# Patient Record
Sex: Female | Born: 1980 | Race: White | Hispanic: No | Marital: Married | Smoking: Never smoker
Health system: Southern US, Community
[De-identification: ages and names within clinical notes are randomized; demographics above are authoritative.]

## PROBLEM LIST (undated history)

## (undated) ENCOUNTER — Inpatient Hospital Stay (HOSPITAL_COMMUNITY): Payer: Self-pay

## (undated) DIAGNOSIS — Z8759 Personal history of other complications of pregnancy, childbirth and the puerperium: Secondary | ICD-10-CM

## (undated) DIAGNOSIS — Z8619 Personal history of other infectious and parasitic diseases: Secondary | ICD-10-CM

## (undated) DIAGNOSIS — D649 Anemia, unspecified: Secondary | ICD-10-CM

## (undated) HISTORY — PX: WISDOM TOOTH EXTRACTION: SHX21

## (undated) HISTORY — DX: Personal history of other infectious and parasitic diseases: Z86.19

## (undated) HISTORY — DX: Personal history of other complications of pregnancy, childbirth and the puerperium: Z87.59

## (undated) HISTORY — PX: APPENDECTOMY: SHX54

---

## 2014-10-20 NOTE — L&D Delivery Note (Signed)
Delivery Note Pt reached complete dilation and pushed for about an hour and at 3:37 AM a viable female was delivered via  (Presentation:OA).  APGAR:8,9 ; weight pending  .   Placenta status: delivered spontaneously .  Cord:  with the following complications:none .  Anesthesia:  epidural Episiotomy:  na Lacerations:  Small first degree vaginal Suture Repair: 3.0 vicryl rapide for hemostasis Est. Blood Loss (mL):  356ml Mom to postpartum.  Baby to Couplet care / Skin to Skin.  Stephanie Prince 08/15/2015, 3:57 AM

## 2015-01-14 LAB — OB RESULTS CONSOLE ABO/RH: RH TYPE: POSITIVE

## 2015-01-14 LAB — OB RESULTS CONSOLE GC/CHLAMYDIA
CHLAMYDIA, DNA PROBE: NEGATIVE
Gonorrhea: NEGATIVE

## 2015-01-14 LAB — OB RESULTS CONSOLE RUBELLA ANTIBODY, IGM: RUBELLA: IMMUNE

## 2015-02-14 LAB — OB RESULTS CONSOLE HEPATITIS B SURFACE ANTIGEN: HEP B S AG: NEGATIVE

## 2015-06-28 LAB — OB RESULTS CONSOLE HIV ANTIBODY (ROUTINE TESTING): HIV: NONREACTIVE

## 2015-06-28 LAB — OB RESULTS CONSOLE RPR: RPR: NONREACTIVE

## 2015-07-17 LAB — OB RESULTS CONSOLE GBS: GBS: POSITIVE

## 2015-08-08 ENCOUNTER — Other Ambulatory Visit: Payer: Self-pay | Admitting: Student

## 2015-08-08 ENCOUNTER — Inpatient Hospital Stay (HOSPITAL_COMMUNITY)
Admission: AD | Admit: 2015-08-08 | Discharge: 2015-08-08 | Disposition: A | Discharge status: Home / Self Care | Payer: 59 | Source: Ambulatory Visit | Attending: Obstetrics and Gynecology | Admitting: Obstetrics and Gynecology

## 2015-08-08 ENCOUNTER — Other Ambulatory Visit (HOSPITAL_COMMUNITY): Payer: Self-pay | Admitting: Student

## 2015-08-08 ENCOUNTER — Other Ambulatory Visit (HOSPITAL_COMMUNITY)
Admission: AD | Admit: 2015-08-08 | Discharge: 2015-08-08 | Disposition: A | Discharge status: Home / Self Care | Payer: 59 | Source: Ambulatory Visit | Attending: Obstetrics and Gynecology | Admitting: Obstetrics and Gynecology

## 2015-08-08 ENCOUNTER — Encounter (HOSPITAL_COMMUNITY): Payer: Self-pay

## 2015-08-08 DIAGNOSIS — Z3A34 34 weeks gestation of pregnancy: Secondary | ICD-10-CM | POA: Diagnosis not present

## 2015-08-08 DIAGNOSIS — O133 Gestational [pregnancy-induced] hypertension without significant proteinuria, third trimester: Secondary | ICD-10-CM

## 2015-08-08 DIAGNOSIS — O139 Gestational [pregnancy-induced] hypertension without significant proteinuria, unspecified trimester: Secondary | ICD-10-CM

## 2015-08-08 DIAGNOSIS — O1493 Unspecified pre-eclampsia, third trimester: Secondary | ICD-10-CM | POA: Diagnosis not present

## 2015-08-08 DIAGNOSIS — O1213 Gestational proteinuria, third trimester: Secondary | ICD-10-CM

## 2015-08-08 HISTORY — DX: Anemia, unspecified: D64.9

## 2015-08-08 LAB — COMPREHENSIVE METABOLIC PANEL
ALBUMIN: 3 g/dL — AB (ref 3.5–5.0)
ALT: 20 U/L (ref 14–54)
ANION GAP: 9 (ref 5–15)
AST: 31 U/L (ref 15–41)
Alkaline Phosphatase: 131 U/L — ABNORMAL HIGH (ref 38–126)
BILIRUBIN TOTAL: 0.4 mg/dL (ref 0.3–1.2)
BUN: 9 mg/dL (ref 6–20)
CO2: 22 mmol/L (ref 22–32)
Calcium: 8.5 mg/dL — ABNORMAL LOW (ref 8.9–10.3)
Chloride: 103 mmol/L (ref 101–111)
Creatinine, Ser: 0.68 mg/dL (ref 0.44–1.00)
GFR calc Af Amer: >60 mL/min (ref >60)
GFR calc non Af Amer: >60 mL/min (ref >60)
GLUCOSE: 83 mg/dL (ref 65–99)
POTASSIUM: 3.7 mmol/L (ref 3.5–5.1)
SODIUM: 134 mmol/L — AB (ref 135–145)
TOTAL PROTEIN: 7.2 g/dL (ref 6.5–8.1)

## 2015-08-08 LAB — CBC
HCT: 32.8 % — ABNORMAL LOW (ref 36.0–46.0)
Hemoglobin: 10.7 g/dL — ABNORMAL LOW (ref 12.0–15.0)
MCH: 28.8 pg (ref 26.0–34.0)
MCHC: 32.6 g/dL (ref 30.0–36.0)
MCV: 88.4 fL (ref 78.0–100.0)
PLATELETS: 165 10*3/uL (ref 150–400)
RBC: 3.71 MIL/uL — AB (ref 3.87–5.11)
RDW: 13.5 % (ref 11.5–15.5)
WBC: 10.3 10*3/uL (ref 4.0–10.5)

## 2015-08-08 LAB — PROTEIN / CREATININE RATIO, URINE
Creatinine, Urine: 44 mg/dL
PROTEIN CREATININE RATIO: 1.11 mg/mg{creat} — AB (ref 0.00–0.15)
Total Protein, Urine: 49 mg/dL

## 2015-08-08 LAB — URINALYSIS, ROUTINE W REFLEX MICROSCOPIC
BILIRUBIN URINE: NEGATIVE
Glucose, UA: NEGATIVE mg/dL
Ketones, ur: NEGATIVE mg/dL
Leukocytes, UA: NEGATIVE
NITRITE: NEGATIVE
PH: 7.5 (ref 5.0–8.0)
Protein, ur: 30 mg/dL — AB
SPECIFIC GRAVITY, URINE: 1.015 (ref 1.005–1.030)
Urobilinogen, UA: 0.2 mg/dL (ref 0.0–1.0)

## 2015-08-08 LAB — URINE MICROSCOPIC-ADD ON

## 2015-08-08 LAB — URIC ACID: URIC ACID, SERUM: 4.2 mg/dL (ref 2.3–6.6)

## 2015-08-08 LAB — LACTATE DEHYDROGENASE: LDH: 152 U/L (ref 98–192)

## 2015-08-08 NOTE — Discharge Instructions (Signed)
24-Hour Urine Collection HOW DO I DO A 24-HOUR URINE COLLECTION?  When you get up in the morning, urinate in the toilet and flush. Write down the time. This will be your start time on the day of collection and your end time on the next morning.  From then on, collect all of your urine in the plastic jug that is given to you.  Stop collecting your urine 24 hours after you started.  If the plastic jug that is given to you already has liquid in it, that is okay. Do not throw out the liquid or rinse out the jug. Some tests need the liquid to be added to your urine.  Keep your plastic jug cool in an ice chest or keep it in the refrigerator during the test.  When 24 hours are over, bring your plastic jug to the clinic lab. Keep the jug cool in an ice chest while you are bringing it to the lab.   This information is not intended to replace advice given to you by your health care provider. Make sure you discuss any questions you have with your health care provider.   Document Released: 01/02/2009 Document Revised: 10/27/2014 Document Reviewed: 03/01/2014 Elsevier Interactive Patient Education 2016 [employer].     Hypertension During Pregnancy Hypertension, or high blood pressure, is when there is extra pressure inside your blood vessels that carry blood from the heart to the rest of your body (arteries). It can happen at any time in life, including pregnancy. Hypertension during pregnancy can cause problems for you and your baby. Your baby might not weigh as much as he or she should at birth or might be born early (premature). Very bad cases of hypertension during pregnancy can be life-threatening.  Different types of hypertension can occur during pregnancy. These include:  Chronic hypertension. This happens when a woman has hypertension before pregnancy and it continues during pregnancy.  Gestational hypertension. This is when hypertension develops during pregnancy.  Preeclampsia or  toxemia of pregnancy. This is a very serious type of hypertension that develops only during pregnancy. It affects the whole body and can be very dangerous for both mother and baby.  Gestational hypertension and preeclampsia usually go away after your baby is born. Your blood pressure will likely stabilize within 6 weeks. Women who have hypertension during pregnancy have a greater chance of developing hypertension later in life or with future pregnancies. RISK FACTORS There are certain factors that make it more likely for you to develop hypertension during pregnancy. These include:  Having hypertension before pregnancy.  Having hypertension during a previous pregnancy.  Being overweight.  Being older than 40 years.  Being pregnant with more than one baby.  Having diabetes or kidney problems. SIGNS AND SYMPTOMS Chronic and gestational hypertension rarely cause symptoms. Preeclampsia has symptoms, which may include:  Increased protein in your urine. Your health care provider will check for this at every prenatal visit.  Swelling of your hands and face.  Rapid weight gain.  Headaches.  Visual changes.  Being bothered by light.  Abdominal pain, especially in the upper right area.  Chest pain.  Shortness of breath.  Increased reflexes.  Seizures. These occur with a more severe form of preeclampsia, called eclampsia. DIAGNOSIS  You may be diagnosed with hypertension during a regular prenatal exam. At each prenatal visit, you may have:  Your blood pressure checked.  A urine test to check for protein in your urine. The type of hypertension you are diagnosed  with depends on when you developed it. It also depends on your specific blood pressure reading.  Developing hypertension before 20 weeks of pregnancy is consistent with chronic hypertension.  Developing hypertension after 20 weeks of pregnancy is consistent with gestational hypertension.  Hypertension with increased  urinary protein is diagnosed as preeclampsia.  Blood pressure measurements that stay above 828 systolic or 003 diastolic are a sign of severe preeclampsia. TREATMENT Treatment for hypertension during pregnancy varies. Treatment depends on the type of hypertension and how serious it is.  If you take medicine for chronic hypertension, you may need to switch medicines.  Medicines called ACE inhibitors should not be taken during pregnancy.  Low-dose aspirin may be suggested for women who have risk factors for preeclampsia.  If you have gestational hypertension, you may need to take a blood pressure medicine that is safe during pregnancy. Your health care provider will recommend the correct medicine.  If you have severe preeclampsia, you may need to be in the hospital. Health care providers will watch you and your baby very closely. You also may need to take medicine called magnesium sulfate to prevent seizures and lower blood pressure.  Sometimes, an early delivery is needed. This may be the case if the condition worsens. It would be done to protect you and your baby. The only cure for preeclampsia is delivery.  Your health care provider may recommend that you take one low-dose aspirin (81 mg) each day to help prevent high blood pressure during your pregnancy if you are at risk for preeclampsia. You may be at risk for preeclampsia if:  You had preeclampsia or eclampsia during a previous pregnancy.  Your baby did not grow as expected during a previous pregnancy.  You experienced preterm birth with a previous pregnancy.  You experienced a separation of the placenta from the uterus (placental abruption) during a previous pregnancy.  You experienced the loss of your baby during a previous pregnancy.  You are pregnant with more than one baby.  You have other medical conditions, such as diabetes or an autoimmune disease. HOME CARE INSTRUCTIONS  Schedule and keep all of your regular prenatal  care appointments. This is important.  Take medicines only as directed by your health care provider. Tell your health care provider about all medicines you take.  Eat as little salt as possible.  Get regular exercise.  Do not drink alcohol.  Do not use tobacco products.  Do not drink products with caffeine.  Lie on your left side when resting. SEEK IMMEDIATE MEDICAL CARE IF:  You have severe abdominal pain.  You have sudden swelling in your hands, ankles, or face.  You gain 4 pounds (1.8 kg) or more in 1 week.  You vomit repeatedly.  You have vaginal bleeding.  You do not feel your baby moving as much.  You have a headache.  You have blurred or double vision.  You have muscle twitching or spasms.  You have shortness of breath.  You have blue fingernails or lips.  You have blood in your urine. MAKE SURE YOU:  Understand these instructions.  Will watch your condition.  Will get help right away if you are not doing well or get worse.   This information is not intended to replace advice given to you by your health care provider. Make sure you discuss any questions you have with your health care provider.   Document Released: 06/24/2011 Document Revised: 10/27/2014 Document Reviewed: 05/05/2013 Elsevier Interactive Patient Education [employer].

## 2015-08-08 NOTE — MAU Note (Signed)
Pt presents for evaluation of elevated BP at work. Denies HA, visual changes. Has had increased swelling. Denies leaking or bleeding. Reports good fetal movement.

## 2015-08-08 NOTE — MAU Provider Note (Signed)
History     CSN: [id number]  Arrival date and time: 08/08/15 1534   First Provider Initiated Contact with Patient 08/08/15 1558         Chief Complaint  Patient presents with  . PIH eval    HPI Stephanie Prince is a 34 y.o. G2P0010 at [redacted]w[redacted]d who presents with elevated BP.  Has been taking BP at home over the last week; has been in 140-150/90s. Today, took BP at work, it was 170/100.  Denies headache, vision changes, epigastric pain.  Denies contractions, vaginal bleeding, LOF.  Positive fetal movement.    OB History    Gravida Para Term Preterm AB TAB SAB Ectopic Multiple Living   2    1  1          Past Medical History  Diagnosis Date  . Anemia     Past Surgical History  Procedure Laterality Date  . Appendectomy    . Wisdom tooth extraction      History reviewed. No pertinent family history.  Social History  Substance Use Topics  . Smoking status: Never Smoker   . Smokeless tobacco: None  . Alcohol Use: No    Allergies: No Known Allergies  Prescriptions prior to admission  Medication Sig Dispense Refill Last Dose  . ferrous sulfate 325 (65 FE) MG tablet Take 1 tablet by mouth daily.  0     Review of Systems  Constitutional: Negative.   Respiratory: Negative.   Cardiovascular: Negative.   Gastrointestinal: Negative.   Genitourinary: Negative.   Neurological: Negative.    Physical Exam   Blood pressure 154/97, pulse 74, temperature 97.7 F (36.5 C), temperature source Oral, resp. rate 18.  Patient Vitals for the past 24 hrs:  BP Temp Temp src Pulse Resp  08/08/15 1631 136/95 mmHg - - 65 -  08/08/15 1616 151/95 mmHg - - 65 -  08/08/15 1601 153/98 mmHg - - 70 -  08/08/15 1544 154/97 mmHg 97.7 F (36.5 C) Oral 74 18     Physical Exam  Nursing note and vitals reviewed. Constitutional: She is oriented to person, place, and time. She appears well-developed and well-nourished. No distress.  HENT:  Head: Normocephalic and atraumatic.  Eyes:  Conjunctivae are normal. Right eye exhibits no discharge. Left eye exhibits no discharge. No scleral icterus.  Neck: Normal range of motion.  Cardiovascular: Normal rate, regular rhythm and normal heart sounds.   No murmur heard. Respiratory: Effort normal and breath sounds normal. No respiratory distress. She has no wheezes.  GI: Soft.  Musculoskeletal: Normal range of motion. She exhibits edema (BLE).  Neurological: She is alert and oriented to person, place, and time. She has normal reflexes.  No clonus  Skin: Skin is warm and dry. She is not diaphoretic.  Psychiatric: She has a normal mood and affect. Her behavior is normal. Judgment and thought content normal.   Fetal Tracing:  Baseline: 125 Variability: moderate Accelerations: 15x15 Decelerations: none  Toco: 2-5, mild   MAU Course  Procedures Results for orders placed or performed during the hospital encounter of 08/08/15 (from the past 24 hour(s))  Protein / creatinine ratio, urine     Status: Abnormal   Collection Time: 08/08/15  2:01 PM  Result Value Ref Range   Creatinine, Urine 44.00 mg/dL   Total Protein, Urine 49 mg/dL   Protein Creatinine Ratio 1.11 (H) 0.00 - 0.15 mg/mg[Cre]  CBC     Status: Abnormal   Collection Time: 08/08/15  3:55 PM  Result  Value Ref Range   WBC 10.3 4.0 - 10.5 K/uL   RBC 3.71 (L) 3.87 - 5.11 MIL/uL   Hemoglobin 10.7 (L) 12.0 - 15.0 g/dL   HCT 32.8 (L) 36.0 - 46.0 %   MCV 88.4 78.0 - 100.0 fL   MCH 28.8 26.0 - 34.0 pg   MCHC 32.6 30.0 - 36.0 g/dL   RDW 13.5 11.5 - 15.5 %   Platelets 165 150 - 400 K/uL  Comprehensive metabolic panel     Status: Abnormal   Collection Time: 08/08/15  3:55 PM  Result Value Ref Range   Sodium 134 (L) 135 - 145 mmol/L   Potassium 3.7 3.5 - 5.1 mmol/L   Chloride 103 101 - 111 mmol/L   CO2 22 22 - 32 mmol/L   Glucose, Bld 83 65 - 99 mg/dL   BUN 9 6 - 20 mg/dL   Creatinine, Ser 0.68 0.44 - 1.00 mg/dL   Calcium 8.5 (L) 8.9 - 10.3 mg/dL   Total Protein  7.2 6.5 - 8.1 g/dL   Albumin 3.0 (L) 3.5 - 5.0 g/dL   AST 31 15 - 41 U/L   ALT 20 14 - 54 U/L   Alkaline Phosphatase 131 (H) 38 - 126 U/L   Total Bilirubin 0.4 0.3 - 1.2 mg/dL   GFR calc non Af Amer >60 >60 mL/min   GFR calc Af Amer >60 >60 mL/min   Anion gap 9 5 - 15  Lactate dehydrogenase     Status: None   Collection Time: 08/08/15  3:55 PM  Result Value Ref Range   LDH 152 98 - 192 U/L  Uric acid     Status: None   Collection Time: 08/08/15  3:55 PM  Result Value Ref Range   Uric Acid, Serum 4.2 2.3 - 6.6 mg/dL    MDM 1640- C/w Dr. Terri Piedra. Discussed BPs & labs. Pt to collect 24 hours urine, return to office on Friday for BP check & turn in urine. Out of work until Monday.   Assessment and Plan  A: 1. Gestational hypertension, third trimester   2. Proteinuria in pregnancy, antepartum, third trimester    P: Discharge home Jug & instructions given for 24 hour urine collection Return to office on Friday for BP check & to turn in urine Strict pre eclampsia precautions  Jorje Guild, NP  08/08/2015, 3:58 PM

## 2015-08-10 ENCOUNTER — Other Ambulatory Visit: Payer: Self-pay | Admitting: Obstetrics and Gynecology

## 2015-08-10 ENCOUNTER — Inpatient Hospital Stay (HOSPITAL_COMMUNITY)
Admission: AD | Admit: 2015-08-10 | Discharge: 2015-08-10 | Disposition: A | Discharge status: Home / Self Care | Payer: 59 | Source: Ambulatory Visit | Attending: Obstetrics and Gynecology | Admitting: Obstetrics and Gynecology

## 2015-08-10 ENCOUNTER — Encounter (HOSPITAL_COMMUNITY): Payer: Self-pay | Admitting: *Deleted

## 2015-08-10 DIAGNOSIS — O1493 Unspecified pre-eclampsia, third trimester: Secondary | ICD-10-CM | POA: Diagnosis not present

## 2015-08-10 DIAGNOSIS — Z3A35 35 weeks gestation of pregnancy: Secondary | ICD-10-CM | POA: Diagnosis not present

## 2015-08-10 LAB — PROTEIN / CREATININE RATIO, URINE
CREATININE, URINE: 372 mg/dL
Protein Creatinine Ratio: 0.61 mg/mg{Cre} — ABNORMAL HIGH (ref 0.00–0.15)
Total Protein, Urine: 228 mg/dL

## 2015-08-10 LAB — URINALYSIS, ROUTINE W REFLEX MICROSCOPIC
Bilirubin Urine: NEGATIVE
GLUCOSE, UA: NEGATIVE mg/dL
HGB URINE DIPSTICK: NEGATIVE
Ketones, ur: 15 mg/dL — AB
Leukocytes, UA: NEGATIVE
Nitrite: NEGATIVE
PH: 6 (ref 5.0–8.0)
Protein, ur: 100 mg/dL — AB
Urobilinogen, UA: 0.2 mg/dL (ref 0.0–1.0)

## 2015-08-10 LAB — COMPREHENSIVE METABOLIC PANEL
ALK PHOS: 133 U/L — AB (ref 38–126)
ALT: 20 U/L (ref 14–54)
ANION GAP: 6 (ref 5–15)
AST: 28 U/L (ref 15–41)
Albumin: 2.8 g/dL — ABNORMAL LOW (ref 3.5–5.0)
BUN: 15 mg/dL (ref 6–20)
CALCIUM: 8.4 mg/dL — AB (ref 8.9–10.3)
CO2: 24 mmol/L (ref 22–32)
Chloride: 106 mmol/L (ref 101–111)
Creatinine, Ser: 0.74 mg/dL (ref 0.44–1.00)
GFR calc non Af Amer: >60 mL/min (ref >60)
Glucose, Bld: 96 mg/dL (ref 65–99)
Potassium: 4 mmol/L (ref 3.5–5.1)
SODIUM: 136 mmol/L (ref 135–145)
TOTAL PROTEIN: 6.5 g/dL (ref 6.5–8.1)
Total Bilirubin: 0.5 mg/dL (ref 0.3–1.2)

## 2015-08-10 LAB — URINE MICROSCOPIC-ADD ON

## 2015-08-10 LAB — CBC
HCT: 32 % — ABNORMAL LOW (ref 36.0–46.0)
HEMOGLOBIN: 10.5 g/dL — AB (ref 12.0–15.0)
MCH: 29 pg (ref 26.0–34.0)
MCHC: 32.8 g/dL (ref 30.0–36.0)
MCV: 88.4 fL (ref 78.0–100.0)
Platelets: 170 10*3/uL (ref 150–400)
RBC: 3.62 MIL/uL — ABNORMAL LOW (ref 3.87–5.11)
RDW: 13.6 % (ref 11.5–15.5)
WBC: 10.9 10*3/uL — ABNORMAL HIGH (ref 4.0–10.5)

## 2015-08-10 LAB — URIC ACID: URIC ACID, SERUM: 4.5 mg/dL (ref 2.3–6.6)

## 2015-08-10 LAB — LACTATE DEHYDROGENASE: LDH: 154 U/L (ref 98–192)

## 2015-08-10 MED ORDER — BETAMETHASONE SOD PHOS & ACET 6 (3-3) MG/ML IJ SUSP
12.0000 mg | INTRAMUSCULAR | Status: DC
  Administered 2015-08-10: 12 mg via INTRAMUSCULAR
  Filled 2015-08-10: qty 2

## 2015-08-10 NOTE — Progress Notes (Signed)
Noni Saupe NP in to see pt and discuss lab results and d/c plan. Will return tomorrow evening for 2nd BMZ inj. Or sooner for any concerns

## 2015-08-10 NOTE — MAU Provider Note (Signed)
History     CSN: [id number]  Arrival date and time: 08/10/15 1944   None     Chief Complaint  Patient presents with  . Injections   HPI   [name] is a 34 y.o. female G2P0010 at 57w0dwith a history of preeclampsia without severe features, presents to MAU for betamethasone dose 1. In triage the patients BP was elevated She denies changes in her vision, or HA.   She was seen in the office today.  She denies pain   Recent 24 hour urine.   + fetal movements She denies vaginal bleeding or leaking of fluid.   OB History    Gravida Para Term Preterm AB TAB SAB Ectopic Multiple Living   '2    1  1         ' Past Medical History  Diagnosis Date  . Anemia     Past Surgical History  Procedure Laterality Date  . Appendectomy    . Wisdom tooth extraction      History reviewed. No pertinent family history.  Social History  Substance Use Topics  . Smoking status: Never Smoker   . Smokeless tobacco: None  . Alcohol Use: No    Allergies: No Known Allergies  Prescriptions prior to admission  Medication Sig Dispense Refill Last Dose  . calcium carbonate (TUMS - DOSED IN MG ELEMENTAL CALCIUM) 500 MG chewable tablet Chew 2 tablets by mouth daily as needed for indigestion or heartburn.   08/09/2015 at Unknown time  . prenatal vitamin w/FE, FA (NATACHEW) 29-1 MG CHEW chewable tablet Chew 2 tablets by mouth daily at 12 noon.   08/09/2015 at Unknown time   Results for orders placed or performed during the hospital encounter of 08/10/15 (from the past 48 hour(s))  CBC     Status: Abnormal   Collection Time: 08/10/15  8:50 PM  Result Value Ref Range   WBC 10.9 (H) 4.0 - 10.5 K/uL   RBC 3.62 (L) 3.87 - 5.11 MIL/uL   Hemoglobin 10.5 (L) 12.0 - 15.0 g/dL   HCT 32.0 (L) 36.0 - 46.0 %   MCV 88.4 78.0 - 100.0 fL   MCH 29.0 26.0 - 34.0 pg   MCHC 32.8 30.0 - 36.0 g/dL   RDW 13.6 11.5 - 15.5 %   Platelets 170 150 - 400 K/uL  Comprehensive metabolic panel     Status:  Abnormal   Collection Time: 08/10/15  8:50 PM  Result Value Ref Range   Sodium 136 135 - 145 mmol/L   Potassium 4.0 3.5 - 5.1 mmol/L   Chloride 106 101 - 111 mmol/L   CO2 24 22 - 32 mmol/L   Glucose, Bld 96 65 - 99 mg/dL   BUN 15 6 - 20 mg/dL   Creatinine, Ser 0.74 0.44 - 1.00 mg/dL   Calcium 8.4 (L) 8.9 - 10.3 mg/dL   Total Protein 6.5 6.5 - 8.1 g/dL   Albumin 2.8 (L) 3.5 - 5.0 g/dL   AST 28 15 - 41 U/L   ALT 20 14 - 54 U/L   Alkaline Phosphatase 133 (H) 38 - 126 U/L   Total Bilirubin 0.5 0.3 - 1.2 mg/dL   GFR calc non Af Amer >60 >60 mL/min   GFR calc Af Amer >60 >60 mL/min    Comment: (NOTE) The eGFR has been calculated using the CKD EPI equation. This calculation has not been validated in all clinical situations. eGFR's persistently <60 mL/min signify possible Chronic Kidney Disease.  Anion gap 6 5 - 15  Uric acid     Status: None   Collection Time: 08/10/15  8:50 PM  Result Value Ref Range   Uric Acid, Serum 4.5 2.3 - 6.6 mg/dL  Lactate dehydrogenase     Status: None   Collection Time: 08/10/15  8:50 PM  Result Value Ref Range   LDH 154 98 - 192 U/L  Protein / creatinine ratio, urine     Status: Abnormal   Collection Time: 08/10/15  9:20 PM  Result Value Ref Range   Creatinine, Urine 372.00 mg/dL   Total Protein, Urine 228 mg/dL    Comment: NO NORMAL RANGE ESTABLISHED FOR THIS TEST RESULTS CONFIRMED BY MANUAL DILUTION    Protein Creatinine Ratio 0.61 (H) 0.00 - 0.15 mg/mg[Cre]  Urinalysis, Routine w reflex microscopic (not at [facility])     Status: Abnormal   Collection Time: 08/10/15  9:20 PM  Result Value Ref Range   Color, Urine YELLOW YELLOW   APPearance CLEAR CLEAR   Specific Gravity, Urine >1.030 (H) 1.005 - 1.030   pH 6.0 5.0 - 8.0   Glucose, UA NEGATIVE NEGATIVE mg/dL   Hgb urine dipstick NEGATIVE NEGATIVE   Bilirubin Urine NEGATIVE NEGATIVE   Ketones, ur 15 (A) NEGATIVE mg/dL   Protein, ur 100 (A) NEGATIVE mg/dL   Urobilinogen, UA 0.2 0.0 - 1.0 mg/dL    Nitrite NEGATIVE NEGATIVE   Leukocytes, UA NEGATIVE NEGATIVE  Urine microscopic-add on     Status: Abnormal   Collection Time: 08/10/15  9:20 PM  Result Value Ref Range   Squamous Epithelial / LPF FEW (A) RARE   WBC, UA 0-2 <3 WBC/hpf   RBC / HPF 0-2 <3 RBC/hpf   Bacteria, UA RARE RARE   Urine-Other MUCOUS PRESENT     Review of Systems  Constitutional: Negative for fever and chills.  Eyes: Negative for blurred vision.  Cardiovascular: Positive for leg swelling. Negative for chest pain.  Gastrointestinal: Negative for abdominal pain.  Neurological: Negative for dizziness and headaches.   Physical Exam   Blood pressure 133/87, pulse 62, temperature 97.7 F (36.5 C), resp. rate 18.  Physical Exam  Constitutional: She is oriented to person, place, and time. She appears well-developed and well-nourished. No distress.  HENT:  Head: Normocephalic.  Eyes: Pupils are equal, round, and reactive to light.  Neck: Normal range of motion.  Cardiovascular: Normal rate.   Respiratory: Effort normal.  GI: Soft. There is no tenderness.  Musculoskeletal: Normal range of motion.  Neurological: She is alert and oriented to person, place, and time. She displays normal reflexes.  Negative clonus   Skin: Skin is warm. She is not diaphoretic.  Psychiatric: Her behavior is normal.   Fetal Tracing: Baseline: 130 bpm  Variability: Moderate  Accelerations: 15x15 Decelerations: none Toco: irregular contractions  MAU Course  Procedures  None  MDM   2300: Discussed patient with Dr. Ulanda Edison discussed labs, HPI, and physical exam.     Assessment and Plan   A:  1. Preeclampsia, third trimester without severe features.    P:  Discharge home in stable condition Strict preeclampsia precautions Reviewed signs and symptoms of worsening condition Return to MAU if symptoms worsen Follow up with OB on Monday as scheduled Kick counts Return in 24 hours for 2nd betamethasone.    Lezlie Lye, NP 08/10/2015 11:05 PM

## 2015-08-10 NOTE — Discharge Instructions (Signed)

## 2015-08-10 NOTE — MAU Note (Signed)
Altamease Oiler NP talking with pt regarding B/Ps and plan of care

## 2015-08-11 ENCOUNTER — Inpatient Hospital Stay (EMERGENCY_DEPARTMENT_HOSPITAL)
Admission: AD | Admit: 2015-08-11 | Discharge: 2015-08-11 | Disposition: A | Discharge status: Home / Self Care | Payer: 59 | Source: Ambulatory Visit | Attending: Obstetrics and Gynecology | Admitting: Obstetrics and Gynecology

## 2015-08-11 ENCOUNTER — Encounter (HOSPITAL_COMMUNITY): Payer: Self-pay | Admitting: *Deleted

## 2015-08-11 DIAGNOSIS — Z3483 Encounter for supervision of other normal pregnancy, third trimester: Secondary | ICD-10-CM | POA: Diagnosis not present

## 2015-08-11 DIAGNOSIS — O1414 Severe pre-eclampsia complicating childbirth: Secondary | ICD-10-CM | POA: Diagnosis not present

## 2015-08-11 DIAGNOSIS — O1493 Unspecified pre-eclampsia, third trimester: Secondary | ICD-10-CM | POA: Diagnosis not present

## 2015-08-11 LAB — URINALYSIS, ROUTINE W REFLEX MICROSCOPIC
BILIRUBIN URINE: NEGATIVE
Glucose, UA: NEGATIVE mg/dL
KETONES UR: NEGATIVE mg/dL
Leukocytes, UA: NEGATIVE
Nitrite: NEGATIVE
PROTEIN: 30 mg/dL — AB
Specific Gravity, Urine: 1.015 (ref 1.005–1.030)
UROBILINOGEN UA: 0.2 mg/dL (ref 0.0–1.0)
pH: 6.5 (ref 5.0–8.0)

## 2015-08-11 LAB — URINE MICROSCOPIC-ADD ON

## 2015-08-11 MED ORDER — BETAMETHASONE SOD PHOS & ACET 6 (3-3) MG/ML IJ SUSP
12.0000 mg | Freq: Once | INTRAMUSCULAR | Status: AC
  Administered 2015-08-11: 12 mg via INTRAMUSCULAR
  Filled 2015-08-11: qty 2

## 2015-08-11 NOTE — MAU Provider Note (Signed)
History     CSN: [id number]  Arrival date and time: 08/11/15 2111   First Provider Initiated Contact with Patient 08/11/15 2156      Chief Complaint  Patient presents with  . Injections   HPI [name] is a 34 y.o. G2P0010 at [redacted]w[redacted]d who presents to MAU today for 2nd Betamethasone injection. The patient was found to have elevated blood pressures again in triage today. She states mild intermittent headache and feeling of fullness in her head. She states pain is 1/10 at worst. She has not taken anything for pain. She denies headache now. She also denies blurred vision, floaters, peripheral edema, abdominal pain, contractions, vaginal bleeding or LOF. She reports good fetal movement. She has had evaluation for pre-eclampsia on 10/19 and 10/21 which shows significant proteinuria.    OB History    Gravida Para Term Preterm AB TAB SAB Ectopic Multiple Living   2    1  1    0      Past Medical History  Diagnosis Date  . Anemia     Past Surgical History  Procedure Laterality Date  . Appendectomy    . Wisdom tooth extraction      History reviewed. No pertinent family history.  Social History  Substance Use Topics  . Smoking status: Never Smoker   . Smokeless tobacco: None  . Alcohol Use: No    Allergies: No Known Allergies  No prescriptions prior to admission    Review of Systems  Eyes: Negative for blurred vision.       Neg - floaters  Cardiovascular: Negative for leg swelling.  Gastrointestinal: Negative for nausea, vomiting and abdominal pain.  Genitourinary:       Neg - vaginal bleeding, discharge, LOF  Neurological: Negative for headaches.   Physical Exam   Blood pressure 136/91, pulse 67, temperature 97.9 F (36.6 C), resp. rate 18, height 5\' 4"  (1.626 m), weight 156 lb 12.8 oz (71.124 kg).  Physical Exam  Nursing note and vitals reviewed. Constitutional: She is oriented to person, place, and time. She appears well-developed and well-nourished. No  distress.  HENT:  Head: Normocephalic and atraumatic.  Cardiovascular: Normal rate.   Respiratory: Effort normal.  GI: Soft. She exhibits no distension and no mass. There is no tenderness. There is no rebound and no guarding.  Musculoskeletal: She exhibits no edema.  Neurological: She is alert and oriented to person, place, and time. She has normal reflexes.  No clonus  Skin: Skin is warm and dry. No erythema.  Psychiatric: She has a normal mood and affect.    Patient Vitals for the past 24 hrs:  BP Temp Pulse Resp Height Weight  08/11/15 2222 136/91 mmHg - 67 18 - -  08/11/15 2216 136/91 mmHg - 67 - - -  08/11/15 2200 135/90 mmHg - 75 - - -  08/11/15 2150 138/93 mmHg - 79 - - -  08/11/15 2129 154/94 mmHg - 91 - - -  08/11/15 2126 - 97.9 F (36.6 C) - 18 5\' 4"  (1.626 m) 156 lb 12.8 oz (71.124 kg)    Results for orders placed or performed during the hospital encounter of 08/11/15 (from the past 24 hour(s))  Urinalysis, Routine w reflex microscopic (not at [facility])     Status: Abnormal   Collection Time: 08/11/15  9:46 PM  Result Value Ref Range   Color, Urine YELLOW YELLOW   APPearance CLEAR CLEAR   Specific Gravity, Urine 1.015 1.005 - 1.030   pH  6.5 5.0 - 8.0   Glucose, UA NEGATIVE NEGATIVE mg/dL   Hgb urine dipstick TRACE (A) NEGATIVE   Bilirubin Urine NEGATIVE NEGATIVE   Ketones, ur NEGATIVE NEGATIVE mg/dL   Protein, ur 30 (A) NEGATIVE mg/dL   Urobilinogen, UA 0.2 0.0 - 1.0 mg/dL   Nitrite NEGATIVE NEGATIVE   Leukocytes, UA NEGATIVE NEGATIVE  Urine microscopic-add on     Status: None   Collection Time: 08/11/15  9:46 PM  Result Value Ref Range   Squamous Epithelial / LPF RARE RARE   WBC, UA 0-2 <3 WBC/hpf   Bacteria, UA RARE RARE   Urine-Other MUCOUS PRESENT      Fetal Monitoring: Baseline: 125 bpm, moderate variability, + accelerations, no decelerations Contractions: None   MAU Course  Procedures None  MDM Serial BPs UA today Discussed with Dr. Ulanda Edison.  He does not believe that additional work-up for elevated blood pressure is necessary today as patient is asymptomatic and has improved blood pressures since yesterday.  Second dose of Betamethasone given today  Assessment and Plan  A: SIUP at [redacted]w[redacted]d Pre-eclampsia without severe features  P: Discharge home Tylenol PRN for pain advised Preterm labor and pre-eclampsia precautions discussed Patient advised to follow-up with Dr. Marvel Plan as scheduled for routine prenatal care on Monday or sooner PRN Patient may return to MAU as needed or if her condition were to change or worsen   Luvenia Redden, PA-C  08/11/2015, 11:58 PM

## 2015-08-11 NOTE — Progress Notes (Signed)
Kerry Hough PA in to discuss d/c plan with pt

## 2015-08-11 NOTE — Discharge Instructions (Signed)

## 2015-08-11 NOTE — MAU Note (Signed)
Here for 2nd BMZ injection. No pain currently. Flushed feeling in face and "fullness" in head but no pain

## 2015-08-14 ENCOUNTER — Inpatient Hospital Stay (HOSPITAL_COMMUNITY)
Admission: AD | Admit: 2015-08-14 | Discharge: 2015-08-17 | DRG: 775 | Disposition: A | Discharge status: Home / Self Care | Payer: 59 | Source: Ambulatory Visit | Attending: Obstetrics and Gynecology | Admitting: Obstetrics and Gynecology

## 2015-08-14 ENCOUNTER — Inpatient Hospital Stay (HOSPITAL_COMMUNITY): Payer: 59 | Admitting: Anesthesiology

## 2015-08-14 ENCOUNTER — Encounter (HOSPITAL_COMMUNITY): Payer: Self-pay | Admitting: *Deleted

## 2015-08-14 DIAGNOSIS — Z3A35 35 weeks gestation of pregnancy: Secondary | ICD-10-CM | POA: Diagnosis not present

## 2015-08-14 DIAGNOSIS — Z8759 Personal history of other complications of pregnancy, childbirth and the puerperium: Secondary | ICD-10-CM | POA: Diagnosis present

## 2015-08-14 DIAGNOSIS — O99824 Streptococcus B carrier state complicating childbirth: Secondary | ICD-10-CM | POA: Diagnosis present

## 2015-08-14 DIAGNOSIS — O1414 Severe pre-eclampsia complicating childbirth: Secondary | ICD-10-CM | POA: Diagnosis present

## 2015-08-14 DIAGNOSIS — O1410 Severe pre-eclampsia, unspecified trimester: Secondary | ICD-10-CM | POA: Diagnosis present

## 2015-08-14 DIAGNOSIS — Z3483 Encounter for supervision of other normal pregnancy, third trimester: Secondary | ICD-10-CM | POA: Diagnosis present

## 2015-08-14 LAB — COMPREHENSIVE METABOLIC PANEL
ALK PHOS: 124 U/L (ref 38–126)
ALT: 17 U/L (ref 14–54)
AST: 23 U/L (ref 15–41)
Albumin: 2.9 g/dL — ABNORMAL LOW (ref 3.5–5.0)
Anion gap: 5 (ref 5–15)
BUN: 12 mg/dL (ref 6–20)
CALCIUM: 7.9 mg/dL — AB (ref 8.9–10.3)
CHLORIDE: 100 mmol/L — AB (ref 101–111)
CO2: 23 mmol/L (ref 22–32)
Creatinine, Ser: 0.69 mg/dL (ref 0.44–1.00)
GFR calc non Af Amer: >60 mL/min (ref >60)
Glucose, Bld: 79 mg/dL (ref 65–99)
Potassium: 3.6 mmol/L (ref 3.5–5.1)
SODIUM: 128 mmol/L — AB (ref 135–145)
Total Bilirubin: 0.2 mg/dL — ABNORMAL LOW (ref 0.3–1.2)
Total Protein: 7.1 g/dL (ref 6.5–8.1)

## 2015-08-14 LAB — TYPE AND SCREEN
ABO/RH(D): O POS
Antibody Screen: NEGATIVE

## 2015-08-14 LAB — CBC
HCT: 32.7 % — ABNORMAL LOW (ref 36.0–46.0)
HCT: 31.4 % — ABNORMAL LOW (ref 36.0–46.0)
HEMOGLOBIN: 10.7 g/dL — AB (ref 12.0–15.0)
HEMOGLOBIN: 10.2 g/dL — AB (ref 12.0–15.0)
MCH: 28.9 pg (ref 26.0–34.0)
MCH: 28.7 pg (ref 26.0–34.0)
MCHC: 32.7 g/dL (ref 30.0–36.0)
MCHC: 32.5 g/dL (ref 30.0–36.0)
MCV: 88.4 fL (ref 78.0–100.0)
MCV: 88.2 fL (ref 78.0–100.0)
PLATELETS: 160 10*3/uL (ref 150–400)
Platelets: 166 10*3/uL (ref 150–400)
RBC: 3.7 MIL/uL — AB (ref 3.87–5.11)
RBC: 3.56 MIL/uL — ABNORMAL LOW (ref 3.87–5.11)
RDW: 13.5 % (ref 11.5–15.5)
RDW: 13.5 % (ref 11.5–15.5)
WBC: 10.5 10*3/uL (ref 4.0–10.5)
WBC: 13.4 10*3/uL — AB (ref 4.0–10.5)

## 2015-08-14 LAB — URIC ACID: URIC ACID, SERUM: 4.5 mg/dL (ref 2.3–6.6)

## 2015-08-14 LAB — ABO/RH: ABO/RH(D): O POS

## 2015-08-14 MED ORDER — DIPHENHYDRAMINE HCL 50 MG/ML IJ SOLN: 12.5000 mg | INTRAMUSCULAR | Status: DC | PRN

## 2015-08-14 MED ORDER — LACTATED RINGERS IV SOLN
INTRAVENOUS | Status: DC
  Administered 2015-08-14 – 2015-08-15 (×3): via INTRAVENOUS

## 2015-08-14 MED ORDER — FENTANYL 2.5 MCG/ML BUPIVACAINE 1/10 % EPIDURAL INFUSION (WH - ANES)
14.0000 mL/h | INTRAMUSCULAR | Status: DC | PRN
  Administered 2015-08-14 – 2015-08-15 (×3): 14 mL/h via EPIDURAL
  Filled 2015-08-14 (×2): qty 125

## 2015-08-14 MED ORDER — OXYTOCIN BOLUS FROM INFUSION: 500.0000 mL | INTRAVENOUS | Status: DC

## 2015-08-14 MED ORDER — ONDANSETRON HCL 4 MG/2ML IJ SOLN: 4.0000 mg | Freq: Four times a day (QID) | INTRAMUSCULAR | Status: DC | PRN

## 2015-08-14 MED ORDER — OXYCODONE-ACETAMINOPHEN 5-325 MG PO TABS: 1.0000 | ORAL_TABLET | ORAL | Status: DC | PRN

## 2015-08-14 MED ORDER — ACETAMINOPHEN 325 MG PO TABS
650.0000 mg | ORAL_TABLET | ORAL | Status: DC | PRN
Start: 2015-08-14 — End: 2015-08-15
  Administered 2015-08-14: 650 mg via ORAL
  Filled 2015-08-14: qty 2

## 2015-08-14 MED ORDER — PENICILLIN G POTASSIUM 5000000 UNITS IJ SOLR
5.0000 10*6.[IU] | Freq: Once | INTRAVENOUS | Status: AC
  Administered 2015-08-14: 5 10*6.[IU] via INTRAVENOUS
  Filled 2015-08-14: qty 5

## 2015-08-14 MED ORDER — TERBUTALINE SULFATE 1 MG/ML IJ SOLN: 0.2500 mg | Freq: Once | INTRAMUSCULAR | Status: DC | PRN

## 2015-08-14 MED ORDER — PHENYLEPHRINE 40 MCG/ML (10ML) SYRINGE FOR IV PUSH (FOR BLOOD PRESSURE SUPPORT)
80.0000 ug | PREFILLED_SYRINGE | INTRAVENOUS | Status: DC | PRN
  Administered 2015-08-14: 40 ug via INTRAVENOUS
  Filled 2015-08-14: qty 2
  Filled 2015-08-14: qty 20

## 2015-08-14 MED ORDER — ONDANSETRON HCL 4 MG/2ML IJ SOLN
4.0000 mg | Freq: Four times a day (QID) | INTRAMUSCULAR | Status: DC | PRN
  Administered 2015-08-14: 4 mg via INTRAVENOUS
  Filled 2015-08-14: qty 2

## 2015-08-14 MED ORDER — LABETALOL HCL 5 MG/ML IV SOLN: 20.0000 mg | INTRAVENOUS | Status: DC | PRN

## 2015-08-14 MED ORDER — HYDRALAZINE HCL 20 MG/ML IJ SOLN: 10.0000 mg | Freq: Once | INTRAMUSCULAR | Status: DC | PRN

## 2015-08-14 MED ORDER — LIDOCAINE HCL (PF) 1 % IJ SOLN: 30.0000 mL | INTRAMUSCULAR | Status: DC | PRN

## 2015-08-14 MED ORDER — MAGNESIUM SULFATE BOLUS VIA INFUSION
4.0000 g | Freq: Once | INTRAVENOUS | Status: AC
  Administered 2015-08-14: 4 g via INTRAVENOUS
  Filled 2015-08-14: qty 500

## 2015-08-14 MED ORDER — CITRIC ACID-SODIUM CITRATE 334-500 MG/5ML PO SOLN: 30.0000 mL | ORAL | Status: DC | PRN

## 2015-08-14 MED ORDER — LIDOCAINE HCL (PF) 1 % IJ SOLN
INTRAMUSCULAR | Status: DC | PRN
  Administered 2015-08-14 (×2): 4 mL

## 2015-08-14 MED ORDER — OXYTOCIN 40 UNITS IN LACTATED RINGERS INFUSION - SIMPLE MED
1.0000 m[IU]/min | INTRAVENOUS | Status: DC
  Administered 2015-08-14: 2 m[IU]/min via INTRAVENOUS
  Filled 2015-08-14: qty 1000

## 2015-08-14 MED ORDER — BUTORPHANOL TARTRATE 1 MG/ML IJ SOLN
1.0000 mg | INTRAMUSCULAR | Status: DC | PRN
Start: 2015-08-14 — End: 2015-08-15

## 2015-08-14 MED ORDER — OXYCODONE-ACETAMINOPHEN 5-325 MG PO TABS: 2.0000 | ORAL_TABLET | ORAL | Status: DC | PRN

## 2015-08-14 MED ORDER — LACTATED RINGERS IV SOLN: 500.0000 mL | INTRAVENOUS | Status: DC | PRN

## 2015-08-14 MED ORDER — MAGNESIUM SULFATE 50 % IJ SOLN
2.0000 g/h | INTRAMUSCULAR | Status: DC
  Administered 2015-08-15 – 2015-08-16 (×2): 2 g/h via INTRAVENOUS
  Filled 2015-08-14 (×3): qty 80

## 2015-08-14 MED ORDER — OXYTOCIN 40 UNITS IN LACTATED RINGERS INFUSION - SIMPLE MED: 62.5000 mL/h | INTRAVENOUS | Status: DC

## 2015-08-14 MED ORDER — PENICILLIN G POTASSIUM 5000000 UNITS IJ SOLR
2.5000 10*6.[IU] | INTRAVENOUS | Status: DC
  Administered 2015-08-14 – 2015-08-15 (×3): 2.5 10*6.[IU] via INTRAVENOUS
  Filled 2015-08-14 (×6): qty 2.5

## 2015-08-14 MED ORDER — EPHEDRINE 5 MG/ML INJ
10.0000 mg | INTRAVENOUS | Status: DC | PRN
  Filled 2015-08-14: qty 2

## 2015-08-14 NOTE — Progress Notes (Signed)
efm d/ced per L. Leftwich-Kirby CNM

## 2015-08-14 NOTE — Progress Notes (Addendum)
Patient ID: Stephanie Prince, female   DOB: 1981/05/04, 34 y.o.   MRN: [id number] Pt started on pitocin and feeling mild ctx No PIH sx BP 130-150/90's on magnesium  Cervix 50/4/-2 AROM clear  PCN on board for +GBS Plans epidural D/w pt possible NICU admission for baby Repeat CBC for epidural

## 2015-08-14 NOTE — Progress Notes (Signed)
Patient ID: Stephanie Prince, female   DOB: Jun 24, 1981, 34 y.o.   MRN: [id number] Pt received epidural and more comfortable, but nauseous afeb BP 130/60 FHR category 1 with occasional mild variables  Cervix 80/6-7/-1 OP IUPC placed to adjust pitocin Zofran for nausea Follow progress.

## 2015-08-14 NOTE — Anesthesia Procedure Notes (Signed)
Epidural Patient location during procedure: OB  Staffing Anesthesiologist: Ellason Segar Performed by: anesthesiologist   Preanesthetic Checklist Completed: patient identified, site marked, surgical consent, pre-op evaluation, timeout performed, IV checked, risks and benefits discussed and monitors and equipment checked  Epidural Patient position: sitting Prep: site prepped and draped and DuraPrep Patient monitoring: continuous pulse ox and blood pressure Approach: midline Location: L3-L4 Injection technique: LOR saline  Needle:  Needle type: Tuohy  Needle gauge: 17 G Needle length: 9 cm and 9 Needle insertion depth: 5 cm cm Catheter type: closed end flexible Catheter size: 19 Gauge Catheter at skin depth: 10 cm Test dose: negative  Assessment Events: blood not aspirated, injection not painful, no injection resistance, negative IV test and no paresthesia  Additional Notes Patient identified. Risks/Benefits/Options discussed with patient including but not limited to bleeding, infection, nerve damage, paralysis, failed block, incomplete pain control, headache, blood pressure changes, nausea, vomiting, reactions to medication both or allergic, itching and postpartum back pain. Confirmed with bedside nurse the patient's most recent platelet count. Confirmed with patient that they are not currently taking any anticoagulation, have any bleeding history or any family history of bleeding disorders. Patient expressed understanding and wished to proceed. All questions were answered. Sterile technique was used throughout the entire procedure. Please see nursing notes for vital signs. Test dose was given through epidural catheter and negative prior to continuing to dose epidural or start infusion. Warning signs of high block given to the patient including shortness of breath, tingling/numbness in hands, complete motor block, or any concerning symptoms with instructions to call for help. Patient was  given instructions on fall risk and not to get out of bed. All questions and concerns addressed with instructions to call with any issues or inadequate analgesia.      

## 2015-08-14 NOTE — H&P (Signed)
Ronita Hargreaves is a 34 y.o. female G2P0010 at 27 4/7 weeks (EDD 09/14/15 by LMP c/w 10 week [place]) presents for IOL given findings c/w severe preeclampsia.  Preanatal care was uneventful until she began to note elevated BP on 08/09/15 and PIH labs showed a 24 hour urine protein of 567mg .  Other labs were normal and she was asymptomatic, so she was followed closely.  [place] 10/24 showed normal growth at 23%ile.  Today she noted BP rising to more severe range at home and came in.  BP now consistently severe with systolics in the 993'T.  Labs still WNL.  She does have some indigestion, but no HA.  Received betamethasone 10/21 and 10/22.  She is GBS positive by a urine culture in the first trimester.   Maternal Medical History:  Contractions: Frequency: irregular.   Perceived severity is mild.    Fetal activity: Perceived fetal activity is normal.    Prenatal complications: Pre-eclampsia.     OB History    Gravida Para Term Preterm AB TAB SAB Ectopic Multiple Living   2    1  1    0    SAB x 1  Past Medical History  Diagnosis Date  . Anemia    Past Surgical History  Procedure Laterality Date  . Appendectomy    . Wisdom tooth extraction     Family History: family history is not on file. Social History:  reports that she has never smoked. She does not have any smokeless tobacco history on file. She reports that she does not drink alcohol or use illicit drugs.   Prenatal Transfer Tool  Maternal Diabetes: No Genetic Screening: Normal Maternal Ultrasounds/Referrals: Normal Fetal Ultrasounds or other Referrals:  None Maternal Substance Abuse:  No Significant Maternal Medications:  None Significant Maternal Lab Results:  Lab values include: Group B Strep positive Other Comments:  None  Review of Systems  Gastrointestinal: Positive for heartburn.  Neurological: Negative for headaches.      Blood pressure 169/96, pulse 54, temperature 97.4 F (36.3 C), temperature source Oral, resp. rate  20. Maternal Exam:  Uterine Assessment: Contraction strength is mild.  Contraction frequency is irregular.   Abdomen: Patient reports no abdominal tenderness. Fetal presentation: vertex  Introitus: Normal vulva. Normal vagina.    Physical Exam  Constitutional: She appears well-developed.  Cardiovascular: Normal rate.   Respiratory: Effort normal.  GI: Soft.  Genitourinary: Vagina normal.  Musculoskeletal: She exhibits edema.  Neurological: She is alert.  Psychiatric: She has a normal mood and affect.    Prenatal labs: ABO, Rh:  O positive Antibody:  negative Rubella:  Immune RPR:   Neg HBsAg:   Neg HIV:   NR GBS:   Positive First trimester screen and AFP WNL CF negative One hour GCT 95   Assessment/Plan: Pt for IOL for severe preeclampsia by BP >160 and 24 hour urine with 567mg  protein 08/10/15.  Will plan magnesium for seizure prophylaxis and induce with pitocin. PCN for +GBS  Ashna Dorough W 08/14/2015, 1:57 PM

## 2015-08-14 NOTE — MAU Provider Note (Signed)
Chief Complaint:  Hypertension   First Provider Initiated Contact with Patient 08/14/15 1251      HPI: Stephanie Prince is a 34 y.o. G2P0010 at [redacted]w[redacted]d who presents to maternity admissions sent from the office for elevated BP. She has had normal course of pregnancy except recent high blood pressure and proteinuria, with diagnosis of preeclampsia without severe features.  She denies h/a, epigastric pain, or visual disturbances.   She reports good fetal movement, reports mild cramping, denies regular contractions, LOF, vaginal bleeding, vaginal itching/burning, urinary symptoms, h/a, dizziness, n/v, or fever/chills.    Hypertension This is a recurrent problem. The current episode started 1 to 4 weeks ago. The problem is unchanged. Pertinent negatives include no blurred vision, chest pain, headaches, neck pain or shortness of breath. There are no associated agents to hypertension. Risk factors: pregnancy. Past treatments include nothing. There are no compliance problems.     Past Medical History: Past Medical History  Diagnosis Date  . Anemia     Past obstetric history: OB History  Gravida Para Term Preterm AB SAB TAB Ectopic Multiple Living  2    1 1     0    # Outcome Date GA Lbr Len/2nd Weight Sex Delivery Anes PTL Lv  2 Current           1 SAB 07/2014 [redacted]w[redacted]d             Past Surgical History: Past Surgical History  Procedure Laterality Date  . Appendectomy    . Wisdom tooth extraction      Family History: History reviewed. No pertinent family history.  Social History: Social History  Substance Use Topics  . Smoking status: Never Smoker   . Smokeless tobacco: None  . Alcohol Use: No    Allergies: No Known Allergies  Meds:  Prescriptions prior to admission  Medication Sig Dispense Refill Last Dose  . calcium carbonate (TUMS - DOSED IN MG ELEMENTAL CALCIUM) 500 MG chewable tablet Chew 2 tablets by mouth daily as needed for indigestion or heartburn.   08/11/2015 at Unknown  time  . prenatal vitamin w/FE, FA (NATACHEW) 29-1 MG CHEW chewable tablet Chew 2 tablets by mouth daily.    08/11/2015 at Unknown time    ROS:  Review of Systems  Constitutional: Negative for fever, chills and fatigue.  HENT: Negative for sinus pressure.   Eyes: Negative for blurred vision and photophobia.  Respiratory: Negative for shortness of breath.   Cardiovascular: Negative for chest pain.  Gastrointestinal: Negative for nausea, vomiting, diarrhea and constipation.  Genitourinary: Negative for dysuria, frequency, flank pain, vaginal bleeding, vaginal discharge, difficulty urinating, vaginal pain and pelvic pain.  Musculoskeletal: Negative for neck pain.  Neurological: Negative for dizziness, weakness and headaches.  Psychiatric/Behavioral: Negative.      I have reviewed patient's Past Medical Hx, Surgical Hx, Family Hx, Social Hx, medications and allergies.   Physical Exam   Patient Vitals for the past 24 hrs:  BP Temp Temp src Pulse Resp  08/14/15 1316 169/96 mmHg - - (!) 54 -  08/14/15 1301 150/99 mmHg - - 65 -  08/14/15 1246 162/99 mmHg - - 65 -  08/14/15 1238 146/96 mmHg - - 78 -  08/14/15 1232 (!) 168/102 mmHg - - 71 -  08/14/15 1219 (!) 163/105 mmHg 97.4 F (36.3 C) Oral 72 20   Constitutional: Well-developed, well-nourished female in no acute distress.  Cardiovascular: normal rate Respiratory: normal effort GI: Abd soft, non-tender, gravid appropriate for gestational age.  MS: Extremities nontender, no edema, normal ROM Neurologic: Alert and oriented x 4.  GU: Neg CVAT.    FHT:  Baseline 135, moderate variability, accelerations present, no decelerations Contractions: q 3-5 mins, mild to palpation   Labs: Results for orders placed or performed during the hospital encounter of 08/14/15 (from the past 24 hour(s))  Comprehensive metabolic panel     Status: Abnormal   Collection Time: 08/14/15 12:25 PM  Result Value Ref Range   Sodium 128 (L) 135 - 145 mmol/L    Potassium 3.6 3.5 - 5.1 mmol/L   Chloride 100 (L) 101 - 111 mmol/L   CO2 23 22 - 32 mmol/L   Glucose, Bld 79 65 - 99 mg/dL   BUN 12 6 - 20 mg/dL   Creatinine, Ser 0.69 0.44 - 1.00 mg/dL   Calcium 7.9 (L) 8.9 - 10.3 mg/dL   Total Protein 7.1 6.5 - 8.1 g/dL   Albumin 2.9 (L) 3.5 - 5.0 g/dL   AST 23 15 - 41 U/L   ALT 17 14 - 54 U/L   Alkaline Phosphatase 124 38 - 126 U/L   Total Bilirubin 0.2 (L) 0.3 - 1.2 mg/dL   GFR calc non Af Amer >60 >60 mL/min   GFR calc Af Amer >60 >60 mL/min   Anion gap 5 5 - 15  CBC     Status: Abnormal   Collection Time: 08/14/15 12:25 PM  Result Value Ref Range   WBC 10.5 4.0 - 10.5 K/uL   RBC 3.70 (L) 3.87 - 5.11 MIL/uL   Hemoglobin 10.7 (L) 12.0 - 15.0 g/dL   HCT 32.7 (L) 36.0 - 46.0 %   MCV 88.4 78.0 - 100.0 fL   MCH 28.9 26.0 - 34.0 pg   MCHC 32.7 30.0 - 36.0 g/dL   RDW 13.5 11.5 - 15.5 %   Platelets 166 150 - 400 K/uL  Uric acid     Status: None   Collection Time: 08/14/15 12:25 PM  Result Value Ref Range   Uric Acid, Serum 4.5 2.3 - 6.6 mg/dL      Imaging:  No results found.  MAU Course/MDM: I have ordered labs and reviewed results. Reviewed FHR tracing. Consult Dr Marvel Plan.  With severe range blood pressures, likely preeclampsia with severe features and delivery is recommended.  Pt admitted for IOL per Dr Marvel Plan.  Pt stable at time of transfer.  Assessment: G2P0010 @[redacted]w[redacted]d   Preeclampsia with severe features GBS unknown  Plan: Admit to [facility] for IOL    Medication List    ASK your doctor about these medications        calcium carbonate 500 MG chewable tablet  Commonly known as:  TUMS - dosed in mg elemental calcium  Chew 2 tablets by mouth daily as needed for indigestion or heartburn.     prenatal vitamin w/FE, FA 29-1 MG Chew chewable tablet  Chew 2 tablets by mouth daily.        Fatima Blank Certified Nurse-Midwife 08/14/2015 2:10 PM

## 2015-08-14 NOTE — MAU Note (Signed)
BP was improved yesterday in office, since then has been trending up. Denies HA, visual changes, epigastric pain.  Swelling has improved some

## 2015-08-14 NOTE — MAU Note (Signed)
L Leftwich-Kirby CNM in to discuss lab results and admit plan with pt

## 2015-08-14 NOTE — Anesthesia Preprocedure Evaluation (Signed)
Anesthesia Evaluation  Patient identified by MRN, date of birth, ID band Patient awake    Reviewed: Allergy & Precautions, NPO status , Patient's Chart, lab work & pertinent test results  History of Anesthesia Complications Negative for: history of anesthetic complications  Airway Mallampati: II  TM Distance: >3 FB Neck ROM: Full    Dental no notable dental hx. (+) Dental Advisory Given   Pulmonary neg pulmonary ROS,    Pulmonary exam normal breath sounds clear to auscultation       Cardiovascular hypertension (PreE), Pt. on medications Normal cardiovascular exam Rhythm:Regular Rate:Normal     Neuro/Psych negative neurological ROS  negative psych ROS   GI/Hepatic negative GI ROS, Neg liver ROS,   Endo/Other  negative endocrine ROS  Renal/GU negative Renal ROS  negative genitourinary   Musculoskeletal negative musculoskeletal ROS (+)   Abdominal   Peds negative pediatric ROS (+)  Hematology negative hematology ROS (+)   Anesthesia Other Findings   Reproductive/Obstetrics (+) Pregnancy                             Anesthesia Physical Anesthesia Plan  ASA: III  Anesthesia Plan: Epidural   Post-op Pain Management:    Induction:   Airway Management Planned:   Additional Equipment:   Intra-op Plan:   Post-operative Plan:   Informed Consent: I have reviewed the patients History and Physical, chart, labs and discussed the procedure including the risks, benefits and alternatives for the proposed anesthesia with the patient or authorized representative who has indicated his/her understanding and acceptance.   Dental advisory given  Plan Discussed with:   Anesthesia Plan Comments:         Anesthesia Quick Evaluation

## 2015-08-14 NOTE — MAU Note (Signed)
Urine in lab 

## 2015-08-15 ENCOUNTER — Encounter (HOSPITAL_COMMUNITY): Payer: Self-pay

## 2015-08-15 LAB — COMPREHENSIVE METABOLIC PANEL
ALBUMIN: 2.5 g/dL — AB (ref 3.5–5.0)
ALT: 17 U/L (ref 14–54)
AST: 35 U/L (ref 15–41)
Alkaline Phosphatase: 118 U/L (ref 38–126)
Anion gap: 7 (ref 5–15)
BUN: 8 mg/dL (ref 6–20)
CHLORIDE: 99 mmol/L — AB (ref 101–111)
CO2: 23 mmol/L (ref 22–32)
CREATININE: 0.62 mg/dL (ref 0.44–1.00)
Calcium: 6.3 mg/dL — CL (ref 8.9–10.3)
GFR calc non Af Amer: >60 mL/min (ref >60)
Glucose, Bld: 102 mg/dL — ABNORMAL HIGH (ref 65–99)
Potassium: 3.9 mmol/L (ref 3.5–5.1)
SODIUM: 129 mmol/L — AB (ref 135–145)
Total Bilirubin: 0.5 mg/dL (ref 0.3–1.2)
Total Protein: 6.2 g/dL — ABNORMAL LOW (ref 6.5–8.1)

## 2015-08-15 LAB — CBC
HCT: 32.1 % — ABNORMAL LOW (ref 36.0–46.0)
HEMOGLOBIN: 10.8 g/dL — AB (ref 12.0–15.0)
MCH: 29 pg (ref 26.0–34.0)
MCHC: 33.6 g/dL (ref 30.0–36.0)
MCV: 86.3 fL (ref 78.0–100.0)
PLATELETS: 149 10*3/uL — AB (ref 150–400)
RBC: 3.72 MIL/uL — ABNORMAL LOW (ref 3.87–5.11)
RDW: 13.4 % (ref 11.5–15.5)
WBC: 18.9 10*3/uL — ABNORMAL HIGH (ref 4.0–10.5)

## 2015-08-15 LAB — RPR: RPR Ser Ql: NONREACTIVE

## 2015-08-15 LAB — HIV ANTIBODY (ROUTINE TESTING W REFLEX): HIV SCREEN 4TH GENERATION: NONREACTIVE

## 2015-08-15 MED ORDER — ACETAMINOPHEN 325 MG PO TABS
650.0000 mg | ORAL_TABLET | ORAL | Status: DC | PRN
  Administered 2015-08-16: 650 mg via ORAL
  Filled 2015-08-15: qty 2

## 2015-08-15 MED ORDER — ZOLPIDEM TARTRATE 5 MG PO TABS: 5.0000 mg | ORAL_TABLET | Freq: Every evening | ORAL | Status: DC | PRN

## 2015-08-15 MED ORDER — SIMETHICONE 80 MG PO CHEW: 80.0000 mg | CHEWABLE_TABLET | ORAL | Status: DC | PRN

## 2015-08-15 MED ORDER — SENNOSIDES-DOCUSATE SODIUM 8.6-50 MG PO TABS
2.0000 | ORAL_TABLET | ORAL | Status: DC
  Administered 2015-08-16 (×2): 2 via ORAL
  Filled 2015-08-15 (×2): qty 2

## 2015-08-15 MED ORDER — WITCH HAZEL-GLYCERIN EX PADS: 1.0000 "application " | MEDICATED_PAD | CUTANEOUS | Status: DC | PRN

## 2015-08-15 MED ORDER — BENZOCAINE-MENTHOL 20-0.5 % EX AERO
1.0000 "application " | INHALATION_SPRAY | CUTANEOUS | Status: DC | PRN
  Administered 2015-08-15 – 2015-08-17 (×2): 1 via TOPICAL
  Filled 2015-08-15 (×2): qty 56

## 2015-08-15 MED ORDER — PRENATAL MULTIVITAMIN CH
1.0000 | ORAL_TABLET | Freq: Every day | ORAL | Status: DC
  Administered 2015-08-17: 1 via ORAL
  Filled 2015-08-15 (×2): qty 1

## 2015-08-15 MED ORDER — OXYCODONE-ACETAMINOPHEN 5-325 MG PO TABS: 1.0000 | ORAL_TABLET | ORAL | Status: DC | PRN

## 2015-08-15 MED ORDER — DIBUCAINE 1 % RE OINT
1.0000 | TOPICAL_OINTMENT | RECTAL | Status: DC | PRN
Start: 2015-08-15 — End: 2015-08-17
  Administered 2015-08-17: 1 via RECTAL
  Filled 2015-08-15: qty 28

## 2015-08-15 MED ORDER — ONDANSETRON HCL 4 MG/2ML IJ SOLN: 4.0000 mg | INTRAMUSCULAR | Status: DC | PRN

## 2015-08-15 MED ORDER — LANOLIN HYDROUS EX OINT: TOPICAL_OINTMENT | CUTANEOUS | Status: DC | PRN

## 2015-08-15 MED ORDER — OXYCODONE-ACETAMINOPHEN 5-325 MG PO TABS
2.0000 | ORAL_TABLET | ORAL | Status: DC | PRN
Start: 2015-08-15 — End: 2015-08-17

## 2015-08-15 MED ORDER — ONDANSETRON HCL 4 MG PO TABS: 4.0000 mg | ORAL_TABLET | ORAL | Status: DC | PRN

## 2015-08-15 MED ORDER — TETANUS-DIPHTH-ACELL PERTUSSIS 5-2.5-18.5 LF-MCG/0.5 IM SUSP
0.5000 mL | Freq: Once | INTRAMUSCULAR | Status: DC
  Filled 2015-08-15: qty 0.5

## 2015-08-15 MED ORDER — DIPHENHYDRAMINE HCL 25 MG PO CAPS: 25.0000 mg | ORAL_CAPSULE | Freq: Four times a day (QID) | ORAL | Status: DC | PRN

## 2015-08-15 MED ORDER — IBUPROFEN 600 MG PO TABS
600.0000 mg | ORAL_TABLET | Freq: Four times a day (QID) | ORAL | Status: DC
  Administered 2015-08-15 – 2015-08-17 (×10): 600 mg via ORAL
  Filled 2015-08-15 (×10): qty 1

## 2015-08-15 MED ORDER — LABETALOL HCL 100 MG PO TABS
100.0000 mg | ORAL_TABLET | Freq: Two times a day (BID) | ORAL | Status: DC
  Administered 2015-08-15 – 2015-08-17 (×4): 100 mg via ORAL
  Filled 2015-08-15 (×5): qty 1

## 2015-08-15 NOTE — Lactation Note (Signed)
This note was copied from the chart of Stephanie Prince. Lactation Consultation Note  Patient Name: Stephanie Prince NOMVE'H Date: 08/15/2015 Reason for consult: Initial assessment;Infant < 6lbs;Late preterm infant  LPI 7 hours old. Assisted mom to latch baby. Baby fussy at breast and mom's nipples flat. After attempting to latch baby, mom fitted with #16 NS. Mom will probably need to be re-fitted soon with a #20 NS, but baby's mouth so small and baby not wanting to open to latch, so began with #16. Mom has easily expressible breast, with colostrum flowing from both breasts. Discussed with mom that NS a temporary device; discussed methods of moving away from its use-especially as milk coming to volume; discussed the need to pump while using in order to protect supply; and the need to watch baby's weight gain carefully. Baby latched briefly with NS, but pulled away and fussy. Baby supplemented by spoon with 3 mls EBM that mom had expressed earlier with a hand pump.  Enc mom to begin using DEBP, and mom able to obtain more EBM. Discussed storage guidelines and enc keeping at bedside, covered, for use at next feeding. Mom given LPI handout with review. Plan is for mom to put baby to breast with cues/at least every 3 hours. Enc mom to supplement with EBM according to guidelines, keeping total feeding time to 30 minutes. Enc mom to post-pump for 15 minutes, followed by hand expression.   Mom given [facility] brochure, aware of OP/BFSG, and [place] phone line assistance after D/C. Discussed how to obtain personal DEBP.      Maternal Data Has patient been taught Hand Expression?: Yes Does the patient have breastfeeding experience prior to this delivery?: No  Feeding Feeding Type: Breast Milk Length of feed:  (3 ml EBM)  LATCH Score/Interventions Latch: Too sleepy or reluctant, no latch achieved, no sucking elicited. Intervention(s): Skin to skin  Audible Swallowing: None Intervention(s): Skin to skin;Hand  expression  Type of Nipple: Everted at rest and after stimulation  Comfort (Breast/Nipple): Soft / non-tender     Hold (Positioning): Assistance needed to correctly position infant at breast and maintain latch. Intervention(s): Breastfeeding basics reviewed;Support Pillows;Position options;Skin to skin  LATCH Score: 5  Lactation Tools Discussed/Used Tools: Pump;Nipple Shields Nipple shield size: 16 Breast pump type: Double-Electric Breast Pump Pump Review: Setup, frequency, and cleaning;Milk Storage Initiated by:: JW Date initiated:: 08/15/15   Consult Status Consult Status: Follow-up Date: 08/16/15 Follow-up type: In-patient    Inocente Salles 08/15/2015, 1:12 PM

## 2015-08-15 NOTE — Progress Notes (Signed)
Patient ID: Stephanie Prince, female   DOB: 05-21-1981, 34 y.o.   MRN: [id number] DOD  Alert, just tired BP 150/99 on magnesium Fundus NT  Continue magnesium x 24 hours then likely d/c Labs ordered for tomorrow AM Baby doing well and with mom May need po labetalol for BP

## 2015-08-15 NOTE — Progress Notes (Signed)
CRITICAL VALUE ALERT  Critical value received:  1027  Date of notification:  08/15/15   Time of notification: 0925  Critical value read back: YES  Nurse who received alert:  Jacklynn Ganong, RN  MD notified (1st page): DR. Melba Coon  Time of first page:  [phone]  MD notified (2nd page):  Time of second page:  Responding MD: DR. Melba Coon  Time MD responded:  [phone]

## 2015-08-16 NOTE — Discharge Summary (Signed)
Obstetric Discharge Summary Reason for Admission: induction of labor Prenatal Procedures: Preeclampsia Intrapartum Procedures: spontaneous vaginal delivery, magnesium  Postpartum Procedures: none Complications-Operative and Postpartum: none HEMOGLOBIN  Date Value Ref Range Status  08/15/2015 10.8* 12.0 - 15.0 g/dL Final   HCT  Date Value Ref Range Status  08/15/2015 32.1* 36.0 - 46.0 % Final    Physical Exam:  General: alert and cooperative Lochia: appropriate Uterine Fundus: firm  Discharge Diagnoses: preterm pregnancy delivered at 35+ weeks for severe preeclampsia  Discharge Information: Date: 08/17/2015 Activity: pelvic rest Diet: routine Medications: Ibuprofen, labetalol Condition: improved Instructions: refer to practice specific booklet Discharge to: home Follow-up Information    Follow up with Logan Bores, MD In 5 days.   Specialty:  Obstetrics and Gynecology   Why:  BP check   Contact information:   [address] [place] [address] [phone]       Newborn Data: Live born female  Birth Weight: 4 lb 15.4 oz (2250 g) APGAR: 8, 8  Home with mother.  Logan Bores 08/17/2015, 8:34 AM

## 2015-08-16 NOTE — Anesthesia Postprocedure Evaluation (Signed)
  Anesthesia Post-op Note  Patient: Stephanie Prince  Procedure(s) Performed: * No procedures listed *  Patient Location: Mother/Baby  Anesthesia Type:Epidural  Level of Consciousness: awake, alert  and oriented  Airway and Oxygen Therapy: Patient Spontanous Breathing  Post-op Pain: none  Post-op Assessment: Post-op Vital signs reviewed, Patient's Cardiovascular Status Stable, Respiratory Function Stable, Patent Airway, No signs of Nausea or vomiting, Adequate PO intake, Pain level controlled and No headache              Post-op Vital Signs: Reviewed and stable  Last Vitals:  Filed Vitals:   08/16/15 0603  BP: 146/92  Pulse: 77  Temp:   Resp: 18    Complications: No apparent anesthesia complications

## 2015-08-16 NOTE — Lactation Note (Addendum)
This note was copied from the chart of Stephanie Prince. Lactation Consultation Note  Patient Name: Stephanie Prince SWFUX'N Date: 08/16/2015 Reason for consult: Follow-up assessment;Late preterm infant;Infant < 6lbs  LPI 33 hours old, [redacted]w[redacted]d CGA. Mom states that baby has seemed to be nursing well at breast, but mom is having sore nipples from pumping yesterday and a slight blister on left nipple while using NS. Mom states that when she pumps and hand expresses, she is not seeing as much as she did initially. Baby just finished nursing about 20 minutes before this [place] entered room. Baby is cueing to nurse again, but would not latch when mom placed baby to breast. Assisted mom to hand express and spoon-feed a few drops to baby, which baby tolerated well. Assisted mom to start using DEBP and colostrum began flowing. Reviewed supplementation guidelines with mom and enc supplementing with 10-20 mls of ebm/formula.   Reiterated LPI behavior and the need to pump and supplement baby with each feeding. Discussed that baby may appear to be nursing and transferring well at breast, but baby has lost 4% since birth (32 hours), and 2 % of the loss was in the last 12 hours. Enc mom to pump in order to protect her supply and have ebm for supplementation until baby nursing and transferring milk well.    Plan is for mom to put baby to breast first, using NS and supplement with ebm/formula in the NS as needed using the curve-tipped syringe. Enc mom to post-pump after each feeding for 15 minutes followed by hand expression. Mom states that she stopped pumping because she wasn't getting much. Discussed with mom that pumping is for stimulation--since her baby is small and early, and she is using a NS. Enc hand expression for obtaining additional ebm after pumping. Discussed normal progression of milk coming to volume.   Enc mom to nurse with cues, and not wait any longer than 3 hours to feed baby--waking her as needed.  Discussed with mom that since her milk is flowing now, that she can pump several times and see if she is coming close to supplementation amounts, and then decide whether she needs formula.   Discussed assessment, interventions, and plan with patient's RN, Melissa and Central Nursery RN, Butch Penny.  Maternal Data    Feeding Feeding Type: Breast Fed Length of feed: 20 min  LATCH Score/Interventions                      Lactation Tools Discussed/Used Tools: Nipple Shields;Pump;Comfort gels Nipple shield size: 20 Breast pump type: Manual   Consult Status Consult Status: Follow-up Date: 08/17/15 Follow-up type: In-patient    Inocente Salles 08/16/2015, 1:00 PM

## 2015-08-16 NOTE — Progress Notes (Signed)
Patient ID: Stephanie Prince, female   DOB: 1981/09/26, 34 y.o.   MRN: [id number] PPD #1 NSVD/preeclampsia  Pt feels well.  No PIH sx.  Baby doing very well.  In room and latching!  BP improved on labetalol 100mg  po BID  130/80 currently Labs WNL Good UOP  Fundus firm  Will d/c Magnesium and follow BP

## 2015-08-17 MED ORDER — IBUPROFEN 600 MG PO TABS: 600.0000 mg | ORAL_TABLET | Freq: Four times a day (QID) | ORAL | Status: DC

## 2015-08-17 MED ORDER — LABETALOL HCL 100 MG PO TABS: 100.0000 mg | ORAL_TABLET | Freq: Two times a day (BID) | ORAL | Status: DC

## 2015-08-17 NOTE — Progress Notes (Signed)
Post Partum Day 2 Subjective: no complaints and tolerating PO  Feels great  Baby eating well.  Objective: Blood pressure 155/89, pulse 70, temperature 98.4 F (36.9 C), temperature source Oral, resp. rate 18, height 5\' 4"  (1.626 m), weight 70.761 kg (156 lb), SpO2 99 %, unknown if currently breastfeeding.  Physical Exam:  General: alert and cooperative Lochia: appropriate Uterine Fundus: firm     Recent Labs  08/14/15 1819 08/15/15 0850  HGB 10.2* 10.8*  HCT 31.4* 32.1*    Assessment/Plan: Discharge home  BP stable on labetalol   LOS: 3 days   Kesa Birky W 08/17/2015, 8:32 AM

## 2015-08-17 NOTE — Lactation Note (Signed)
This note was copied from the chart of Stephanie Prince. Lactation Consultation Note Follow up visit at 33 hours of age.  Mom reports baby just started this feeding with #20 NS and mom is using syringe to give baby her EBM.  LC updated feedings and I&Os.  Baby has had 3 voids and 1 stool in past 24 hours.  Mom is post pumping and getting about 9 mls of colostrum.  Baby observered to have strong sucking with stimulation and no efforts when not stimulated.  Mom is not continuous with stimulation during whole feeding.  Expected behavior due to GA of [redacted]w[redacted]d.  Discussed need to increase supplementation with formula for baby to have a larger volume due to 8% weight loss. Attempted refitting mom for #16 NS and #20 appears to fit better, but mom continues to practice proper application.  Baby continues to roll in lower lip, LC instructed mom and mgm on ways to flange lips for good latch. After 20 minutes feeding mom had easily expressed transitional milk from right breast.  LC discussed with mom concerns about baby not transferring milk and emptying breast at feedings.    Plan is for mom to feed baby with cues and wake baby every 2 1/2 hours to start feedings. Mom to continue to use NS as needed.   Mom will use EBM 1st and supplement with neosure formula as needed to increase to 18-97mls with each feeding for 8 or more feedings in 24 hours.  Mom to give supplement with syringe at the breast and limit feedings to 30 minutes.  Mom to post pump for 15 minutes on preemie setting and hand express after.  Mom is motivated and appreciative of assistance.  Mom to call MBU RN if baby is not tolerating supplemental goals.   Patient Name: Stephanie Prince [id number] Date: 08/17/2015 Reason for consult: Follow-up assessment;Infant < 6lbs   Maternal Data Has patient been taught Hand Expression?: Yes  Feeding Feeding Type: Breast Fed  LATCH Score/Interventions Latch: Repeated attempts needed to sustain latch, nipple held in  mouth throughout feeding, stimulation needed to elicit sucking reflex. Intervention(s): Skin to skin;Teach feeding cues;Waking techniques Intervention(s): Breast compression;Breast massage  Audible Swallowing: A few with stimulation (few swallows without supplement) Intervention(s): Skin to skin;Hand expression Intervention(s): Skin to skin;Hand expression;Alternate breast massage  Type of Nipple: Everted at rest and after stimulation  Comfort (Breast/Nipple): Soft / non-tender     Hold (Positioning): Assistance needed to correctly position infant at breast and maintain latch. Intervention(s): Breastfeeding basics reviewed;Support Pillows;Position options;Skin to skin  LATCH Score: 7  Lactation Tools Discussed/Used Nipple shield size: 20 Breast pump type: Double-Electric Breast Pump   Consult Status Consult Status: Follow-up Date: 08/18/15 Follow-up type: In-patient    Justice Britain 08/17/2015, 10:41 AM

## 2015-08-17 NOTE — Progress Notes (Signed)
Dr Marvel Plan notified of elevated BP (144/99)  MD feels comfortable with discharge as patient is to follow BP at home and return to office for BP check No further orders received

## 2015-08-18 ENCOUNTER — Ambulatory Visit: Payer: Self-pay

## 2015-08-18 NOTE — Lactation Note (Signed)
This note was copied from the chart of Stephanie Avie Checo. Lactation Consultation Note LPI BF great. Mom is supplementing w/colostrum. Has 47ml pumped after BF. Encouraged to BF first then post-pump to stimulate breast d/t LPI. Mom has been using curve tip syring inserting milk into NS, states it hurts sometimes. Offered SNS. Taught set up and cleaning. Mom put 59ml colostrum in SNS. BF great. Explained not to BF longer than 30 min. D/t LPI getting tired. Monitor fullness of breast before and after BF, how much baby takes in of SNS. Breast filling. Discussed engorgement. Has personal DEBP, demonstrated pumping and suction settings. Encouraged post pumping to build milk supply d/t LPI at least until due date or at least 2-3 weeks.  BF and get plenty of milk from breast since she has plenty.  Mom has short shaft nipples unable to obtain deep latch. Using #20 NS. Mom has shells and aren't wearing them. States are uncomfortable. Encouraged to wear them to assist in everting nipples to wean off of NS. Suggested to loosen straps on bra w/shells. Stressed to call for F/U appt. W/LC out pt. After Ped. F/u appt. Monitor for sleepiness and alertness during feedings, LPI behavior, and jaundice. Encouraged to come to support groups as well. Patient Name: Stephanie Prince [id number] Date: 08/18/2015 Reason for consult: Follow-up assessment;Late preterm infant   Maternal Data    Feeding Feeding Type: Breast Milk Length of feed: 15 min  LATCH Score/Interventions Latch: Grasps breast easily, tongue down, lips flanged, rhythmical sucking. Intervention(s): Teach feeding cues;Waking techniques Intervention(s): Adjust position;Assist with latch;Breast massage;Breast compression  Audible Swallowing: Spontaneous and intermittent  Type of Nipple: Everted at rest and after stimulation Intervention(s): Shells;Double electric pump  Comfort (Breast/Nipple): Filling, red/small blisters or bruises, mild/mod  discomfort  Problem noted: Filling Interventions (Filling): Double electric pump;Frequent nursing;Firm support;Massage Interventions (Mild/moderate discomfort): Hand massage;Hand expression;Pre-pump if needed;Post-pump;Breast shields  Hold (Positioning): Assistance needed to correctly position infant at breast and maintain latch. Intervention(s): Breastfeeding basics reviewed;Position options;Support Pillows;Skin to skin  LATCH Score: 8  Lactation Tools Discussed/Used Tools: Shells;Nipple Shields;Pump;Supplemental Nutrition System Nipple shield size: 20 Shell Type: Inverted Breast pump type: Double-Electric Breast Pump   Consult Status Consult Status: Complete Date: 08/18/15    Theodoro Kalata 08/18/2015, 11:25 AM

## 2015-08-22 ENCOUNTER — Ambulatory Visit (HOSPITAL_COMMUNITY)
Admission: RE | Admit: 2015-08-22 | Discharge: 2015-08-22 | Disposition: A | Discharge status: Home / Self Care | Payer: 59 | Source: Ambulatory Visit | Attending: Obstetrics and Gynecology | Admitting: Obstetrics and Gynecology

## 2015-08-22 NOTE — Lactation Note (Signed)
Lactation Consult  Mother's reason for visit: sore nipples , later preterm  Visit Type:  Feeding assessment  Appointment Notes:  No note  Consult:  Initial Lactation Consultant:  Myer Haff  ________________________________________________________________________ Stephanie Prince Name: Stephanie Prince Date of Birth: 08/15/2013 Pediatrician:Dr. Patsi Sears  Gender: female Gestational Age: [redacted]w[redacted]d (At Birth) Birth Weight: 4 lb 15.4 oz (2250 g) Weight at Discharge:  Weight: (!) 4 lb 11.5 oz (2140 g) Date of Discharge: 08/18/2015 [facility] Weights   08/15/15 2300 08/16/15 2330 08/17/15 2300  Weight: 4 lb 11.5 oz (2140 g) 4 lb 8.7 oz (2060 g) 4 lb 11.5 oz (2140 g)   Last weight taken from location outside of [facility]: 10/31 5 1/2 oz per mom  Location:Pediatrician's office Weight today: 4-14.5 oz   ___________________________________________________________  Mother's Name: Stephanie Prince Type of delivery:  Vaginal Delivery - with PIH with Magso4  Breastfeeding Experience:  1st baby , difficult at 53st , sore nipples now , overall good  Maternal Medical Conditions:  Pregnancy induced hypertension requiring Mag SO4 tx  Maternal Medications:  PNV ( per mom not consistent , Motrin PRN   ________________________________________________________________________  Breastfeeding History (Post Discharge)  Frequency of breastfeeding:  2-3 hours  Duration of feeding:   20 - 30 mins   Supplementing : none   Pumping: With a DEBP Medela 6 x's a day post feeding with 3- 5 oz expressed with #24 Flanges - comfortable   Infant Intake and Output Assessment  Voids:  8 plus  in 24 hrs.  Color:  Clear yellow Stools:  4 plus  in 24 hrs.  Color:  Yellow  ________________________________________________________________________  Maternal Breast Assessment  Breast:  Full Nipple:  Erect- compressible areolas . Mom's chief complaint was tender sore nipples   With assessment noted slightly pink healthy tissue , no breakdown .  [place] suspects it was due to using a to small NS , when switched to #24 , improved comfort.  Per mom .  Pain level:  2 Pain interventions:  Expressed breast milk and Inverted shells  _______________________________________________________________________ Feeding Assessment/Evaluation  Initial feeding assessment:  Infant's oral assessment:  Variance - short labial frenulum above the gum line - upper lip stretches with oral exam and when  latched at the breast. LC suspects posterior short frenulum , difficult to assess mobility of tongue due to baby not rasing  tongue , relaxed after feeding for 20 mins   Positioning:  Cross cradle Left breast  LATCH documentation:  Latch:  2 = Grasps breast easily, tongue down, lips flanged, rhythmical sucking.  Audible swallowing:  2 = Spontaneous and intermittent  Type of nipple:  2 = Everted at rest and after stimulation  Comfort (Breast/Nipple):  1 = Filling, red/small blisters or bruises, mild/mod discomfort  Hold (Positioning):  1 = Assistance needed to correctly position infant at breast and maintain latch( positioning and flipping upper lip to flanged position for a deeper latch   LATCH score:  8  Attached assessment:  Shallow @ 1st , easily adjusted   Lips flanged:  No.  Lips untucked:  Yes.    Suck assessment:  Nutritive  Tools:  Nipple shield 24 mm ( switched up to #24 due to mom complaint of tender sore nipples ( improved for this mother with latch )  Instructed on use and cleaning of tool:  Yes.    Pre-feed weight:  2226  g , 4-14.5 oz  Post-feed weight:  2280 g , 5-0.4 oz  Amount transferred:  54 ml  ( excellent volume transfer for a 7 day old )  Amount supplemented:  None   Baby had the hiccups and mom re-latched for 5 mins , baby released and fell asleep - did not reweigh.  Baby only fed on one breast.   Total amount pumped post feed: did not post  pump.  Total amount transferred:  54 ml  Total supplement given:  None needed ( baby satisfied )   Lactation Impression:  This later pre-term infant is doing well latching at the breast ( was using the #20 NS @ home , switched to  #24 NS to help mom with sore nipples and the baby able to open mouth wide enough and latch with depth.  54 ml at 7 days out milk transfer from the breast was impressive.  #24 NS was more comfortable for this mom , and nipple well rounded when abby released.  Mom has been very motivated to feed and pump consistently and her milk supply indicates just that.  [place] praised mom for her breast feeding efforts .  Per mom had an appt. With Dr. Marvel Plan and B/P was within normal limits now so B/P med D/c.  Mom does not have excessive edema and milk came in on time. Mom denies issues with engorgement  Discussed with mom the need to get the baby to the fatty milk at the breast quicker so baby's weight with increase steadily. See LC plan below. See above Oral Exam for variance note   Lactation Plan of Care:  [place] praised mom for her efforts breast feeding and pumping and encouraged to continue. Mom willing to come back for F/U next Wednesday at 32 am @ [place] office for weight check and feeding assessment. Mom - rest, naps,plenty of fluids ,especially water , nutritious snacks and meals,  Feedings- every 2-3 hours and with feeding cues.  Growth spurts normal at 7-10 days , 3 weeks , 6 weeks. Cluster feeding normal  Average feeding time - 15 -2o mins or greater . Watch for non - nutritive feeding patterns  Steps for latching - 1st breast express down off 10 -15 mins to get the baby to the fatty milk quicker  Apply #24 NS , latch with breast compressions until comfortable and then intermittent  Rotating @ least between at least 2 breast feeding positions are important for milk supply.  Sore nipple tx : Expressed breast milk to nipples liberally , and use of cold comfort gels x 6  days ( once opened - throw out at 6 days )  Extra pumping : after 4-5 feedings a day for 10 -15 mins and PRN .

## 2015-08-29 ENCOUNTER — Ambulatory Visit (HOSPITAL_COMMUNITY)
Admission: RE | Admit: 2015-08-29 | Discharge: 2015-08-29 | Disposition: A | Discharge status: Home / Self Care | Payer: 59 | Source: Ambulatory Visit | Attending: Obstetrics and Gynecology | Admitting: Obstetrics and Gynecology

## 2015-08-29 NOTE — Lactation Note (Addendum)
Lactation Consult  Mother's reason for visit:  F/U from 11/2  Visit Type:  Feeding assessment  Appointment Notes:  F/U from 11/2 , LPT , 35 4/7 weeks infant - using a #24 NS  , DEBP , - left message to confirm appt. For 11/9  Consult:  Follow-Up Lactation Consultant:  Myer Haff  ________________________________________________________________________  Stephanie Prince Name: Stephanie Prince Date of Birth: 08/15/2013 Pediatrician: Dr. Patsi Sears  Gender: female Gestational Age: [redacted]w[redacted]d (At Birth) Birth Weight: 4 lb 15.4 oz (2250 g) Weight at Discharge:  Weight: (!) 4 lb 11.5 oz (2140 g) Date of Discharge: 08/18/2015 [facility] Weights   08/15/15 2300 08/16/15 2330 08/17/15 2300  Weight: 4 lb 11.5 oz (2140 g) 4 lb 8.7 oz (2060 g) 4 lb 11.5 oz (2140 g)   Last weight taken from location outside of [facility]: 4-14.5 oz  Location:[facility] LC O/P  Weight today: 2472 g , 5-7.2 oz      ________________________________________________________________________  Mother's Name: Stephanie Prince Type of delivery:  Vaginal Delivery  Breastfeeding Experience:  1st baby  Maternal Medical Conditions:  Pregnancy induced hypertension ( resolved )  Maternal Medications:  PNV   ________________________________________________________________________  Breastfeeding History (Post Discharge)  Frequency of breastfeeding:  Every 2-3 hours  Duration of feeding:  15 - 60 mins   Supplementing: none   Pumping:  DEBP Medela - after 3-4 feedings a day - with 2-6 oz Yield   Infant Intake and Output Assessment  Voids:  >6  in 24 hrs.  Color:  Clear yellow Stools:  >2-3  in 24 hrs.  Color:  Yellow  ________________________________________________________________________  Maternal Breast Assessment  Breast:  Full Nipple:  Erect Pain level:  0 Pain interventions:  Expressed breast  milk  _______________________________________________________________________ Feeding Assessment/Evaluation - baby alert , rooting . Skin appears well hydrated   Initial feeding assessment:  Infant's oral assessment:  Variance - short labial frenulum above gum line , able to stretch upper lip well with oral exam , and at the latch with a NS  LC suspects a short posterior frenulum , baby able to stretch tongue over gum line short distance and raise it a short distance.   Positioning:  Cross cradle Right breast  LATCH documentation:  Latch:  2 = Grasps breast easily, tongue down, lips flanged, rhythmical sucking.  Audible swallowing:  2 = Spontaneous and intermittent  Type of nipple:  2 = Everted at rest and after stimulation  Comfort (Breast/Nipple):  1 = Filling, red/small blisters or bruises, mild/mod discomfort  Hold (Positioning):  1 = Assistance needed to correctly position infant at breast and maintain latch  LATCH score:  8   Attached assessment:  Deep  Lips flanged:  No. ( reminded mom to flip upper lip to flanged position  Lips untucked:  Yes.    Suck assessment:  Nutritive  Tools:  Nipple shield 24 mm Instructed on use and cleaning of tool:  Yes.    Pre-feed weight:  2472 g , 5-7.2 oz  Post-feed weight:  2512  g , 5-8.6 oz  Amount transferred:  40 ml  Amount supplemented: none   Additional Feeding Assessment -   Infant's oral assessment:  Variance ( see above note )   Positioning:  Football Right breast  LATCH documentation:  Latch:  2 = Grasps breast easily, tongue down, lips flanged, rhythmical sucking.  Audible swallowing:  2 = Spontaneous and intermittent  Type of nipple:  2 = Everted at rest and after  stimulation  Comfort (Breast/Nipple):  1 = Filling, red/small blisters or bruises, mild/mod discomfort  Hold (Positioning):  1 = Assistance needed to correctly position infant at breast and maintain latch  LATCH score: 8   Attached assessment:  Deep  Lips  flanged:  Yes.    Lips untucked:  Yes.    Suck assessment:  Nutritive  Tools:  Nipple shield 24 mm Instructed on use and cleaning of tool:  Yes.    Pre-feed weight:  2484  g , 5-7.6 oz  Post-feed weight:  2514 g , 5-8.7 oz  Amount transferred: 30 ml  Amount supplemented:  None    Total amount pumped post feed:  None   Total amount transferred: 70 ml ( excellent milk transfer for a 41 week old infant )  Total supplement given:  None needed   Lactation impression:  Baby gaining well and back to birth weight and gaining well, ( 8 oz since last LC appt. On 11/2 )  Mom consistent with feeding baby and extra pumping due to use of NS for latching and baby being a LPT . Mom following the plan well. @ oral exam by [facility] with a gloved finger showed mom the oral variance   LC Plan : Praised  Mom for her efforts breast feeding and pumping  F/U with Dr. Jacklynn Ganong for one month check up ( per mom needs to call for appt. Time )  Breast feeding Support group recommended for free weight checks ( Monday 's at 7 pm , or Tuesday at 11am at [facility] )  MOM - plenty of fluids , especially water , nutritious snacks and meals  Continue the same plan as last [place] visit on 11/2  Reminders - If 1st breast is to full - hand express or pre- pump with hand pump so NS fits well and baby gets to the fatty milk quicker 10 -20 ml  Always soften 1st breast well prior to switching to 2nd breast .  If Luellen Pucker only feeds 1st breast , release 2nd breast 10 mins  Extra pumping - after 3-4 feedings post pump 10-15 mins  LC stressed the importance of protecting milk supply.

## 2015-12-31 DIAGNOSIS — L570 Actinic keratosis: Secondary | ICD-10-CM | POA: Diagnosis not present

## 2015-12-31 DIAGNOSIS — D485 Neoplasm of uncertain behavior of skin: Secondary | ICD-10-CM | POA: Diagnosis not present

## 2015-12-31 DIAGNOSIS — L723 Sebaceous cyst: Secondary | ICD-10-CM | POA: Diagnosis not present

## 2016-03-05 DIAGNOSIS — Z124 Encounter for screening for malignant neoplasm of cervix: Secondary | ICD-10-CM | POA: Diagnosis not present

## 2016-03-05 DIAGNOSIS — Z130 Encounter for screening for diseases of the blood and blood-forming organs and certain disorders involving the immune mechanism: Secondary | ICD-10-CM | POA: Diagnosis not present

## 2016-03-05 DIAGNOSIS — Z1151 Encounter for screening for human papillomavirus (HPV): Secondary | ICD-10-CM | POA: Diagnosis not present

## 2016-03-05 DIAGNOSIS — Z1389 Encounter for screening for other disorder: Secondary | ICD-10-CM | POA: Diagnosis not present

## 2016-03-05 DIAGNOSIS — Z6822 Body mass index (BMI) 22.0-22.9, adult: Secondary | ICD-10-CM | POA: Diagnosis not present

## 2016-03-05 DIAGNOSIS — Z01419 Encounter for gynecological examination (general) (routine) without abnormal findings: Secondary | ICD-10-CM | POA: Diagnosis not present

## 2016-04-08 DIAGNOSIS — Z3201 Encounter for pregnancy test, result positive: Secondary | ICD-10-CM | POA: Diagnosis not present

## 2016-04-08 DIAGNOSIS — N911 Secondary amenorrhea: Secondary | ICD-10-CM | POA: Diagnosis not present

## 2016-04-10 DIAGNOSIS — H5213 Myopia, bilateral: Secondary | ICD-10-CM | POA: Diagnosis not present

## 2016-04-29 DIAGNOSIS — Z3A10 10 weeks gestation of pregnancy: Secondary | ICD-10-CM | POA: Diagnosis not present

## 2016-04-29 DIAGNOSIS — O26891 Other specified pregnancy related conditions, first trimester: Secondary | ICD-10-CM | POA: Diagnosis not present

## 2016-05-09 DIAGNOSIS — Z3A11 11 weeks gestation of pregnancy: Secondary | ICD-10-CM | POA: Diagnosis not present

## 2016-05-09 DIAGNOSIS — Z3481 Encounter for supervision of other normal pregnancy, first trimester: Secondary | ICD-10-CM | POA: Diagnosis not present

## 2016-05-09 DIAGNOSIS — Z36 Encounter for antenatal screening of mother: Secondary | ICD-10-CM | POA: Diagnosis not present

## 2016-05-09 LAB — OB RESULTS CONSOLE ABO/RH: RH TYPE: POSITIVE

## 2016-05-09 LAB — OB RESULTS CONSOLE HIV ANTIBODY (ROUTINE TESTING): HIV: NONREACTIVE

## 2016-05-09 LAB — OB RESULTS CONSOLE GC/CHLAMYDIA
CHLAMYDIA, DNA PROBE: NEGATIVE
Gonorrhea: NEGATIVE

## 2016-05-09 LAB — OB RESULTS CONSOLE ANTIBODY SCREEN: ANTIBODY SCREEN: NEGATIVE

## 2016-05-09 LAB — OB RESULTS CONSOLE RUBELLA ANTIBODY, IGM: RUBELLA: IMMUNE

## 2016-05-09 LAB — OB RESULTS CONSOLE RPR: RPR: NONREACTIVE

## 2016-05-09 LAB — OB RESULTS CONSOLE HEPATITIS B SURFACE ANTIGEN: HEP B S AG: NEGATIVE

## 2016-05-13 DIAGNOSIS — Z3A12 12 weeks gestation of pregnancy: Secondary | ICD-10-CM | POA: Diagnosis not present

## 2016-05-13 DIAGNOSIS — Z36 Encounter for antenatal screening of mother: Secondary | ICD-10-CM | POA: Diagnosis not present

## 2016-06-06 DIAGNOSIS — Z36 Encounter for antenatal screening of mother: Secondary | ICD-10-CM | POA: Diagnosis not present

## 2016-06-27 DIAGNOSIS — Z36 Encounter for antenatal screening of mother: Secondary | ICD-10-CM | POA: Diagnosis not present

## 2016-06-27 DIAGNOSIS — Z3A18 18 weeks gestation of pregnancy: Secondary | ICD-10-CM | POA: Diagnosis not present

## 2016-07-24 DIAGNOSIS — Z3482 Encounter for supervision of other normal pregnancy, second trimester: Secondary | ICD-10-CM | POA: Diagnosis not present

## 2016-07-24 DIAGNOSIS — Z23 Encounter for immunization: Secondary | ICD-10-CM | POA: Diagnosis not present

## 2016-07-24 DIAGNOSIS — Z3A22 22 weeks gestation of pregnancy: Secondary | ICD-10-CM | POA: Diagnosis not present

## 2016-08-19 DIAGNOSIS — Z369 Encounter for antenatal screening, unspecified: Secondary | ICD-10-CM | POA: Diagnosis not present

## 2016-09-02 DIAGNOSIS — Z3A28 28 weeks gestation of pregnancy: Secondary | ICD-10-CM | POA: Diagnosis not present

## 2016-09-02 DIAGNOSIS — Z8759 Personal history of other complications of pregnancy, childbirth and the puerperium: Secondary | ICD-10-CM | POA: Diagnosis not present

## 2016-09-02 DIAGNOSIS — Z23 Encounter for immunization: Secondary | ICD-10-CM | POA: Diagnosis not present

## 2016-10-03 MED FILL — AMOX-CLAV 875-125 MG TABLET: 875-125 | 5 days supply | Qty: 10 | Fill #0

## 2016-10-20 NOTE — L&D Delivery Note (Signed)
Delivery Note Pt received epidural and progressed rapidly to complete dilation.  She pushed about 10 minutes and at 11:17 AM a viable female was delivered via vaginal delivery (Presentation: OA  ).  APGAR:8 ,9 ; weight  pending.   Placenta status:delivered spontaneously, .  Cord:  with the following complications: double nuchal  Cord delivered through .    Anesthesia:  epidural Episiotomy:  none Lacerations:  Abrasions only Suture Repair: n/a Est. Blood Loss (mL):  152ml  Mom to postpartum.  Baby to Couplet care / Skin to Skin.  Logan Bores 11/14/2016, 11:26 AM

## 2016-10-22 ENCOUNTER — Ambulatory Visit (INDEPENDENT_AMBULATORY_CARE_PROVIDER_SITE_OTHER): Payer: 59 | Admitting: *Deleted

## 2016-10-22 DIAGNOSIS — Z3685 Encounter for antenatal screening for Streptococcus B: Secondary | ICD-10-CM | POA: Diagnosis not present

## 2016-10-22 DIAGNOSIS — O3433 Maternal care for cervical incompetence, third trimester: Secondary | ICD-10-CM | POA: Diagnosis not present

## 2016-10-22 DIAGNOSIS — Z3A35 35 weeks gestation of pregnancy: Secondary | ICD-10-CM | POA: Diagnosis not present

## 2016-10-22 DIAGNOSIS — O365930 Maternal care for other known or suspected poor fetal growth, third trimester, not applicable or unspecified: Secondary | ICD-10-CM | POA: Diagnosis not present

## 2016-10-22 LAB — OB RESULTS CONSOLE GBS: GBS: NEGATIVE

## 2016-10-22 MED ORDER — BETAMETHASONE SOD PHOS & ACET 6 (3-3) MG/ML IJ SUSP
12.0000 mg | Freq: Once | INTRAMUSCULAR | Status: AC
  Administered 2016-10-22: 12 mg via INTRAMUSCULAR

## 2016-10-22 NOTE — Progress Notes (Signed)
Betamethasone 12 mg given IM to RUOQ.  Next injection due 1/4 @ 1300. Order has been sent to pharmacy.

## 2016-10-23 ENCOUNTER — Ambulatory Visit (INDEPENDENT_AMBULATORY_CARE_PROVIDER_SITE_OTHER): Payer: 59 | Admitting: *Deleted

## 2016-10-23 DIAGNOSIS — O3433 Maternal care for cervical incompetence, third trimester: Secondary | ICD-10-CM

## 2016-10-23 MED ORDER — BETAMETHASONE SOD PHOS & ACET 6 (3-3) MG/ML IJ SUSP
12.0000 mg | Freq: Once | INTRAMUSCULAR | Status: AC
  Administered 2016-10-23: 12 mg via INTRAMUSCULAR

## 2016-10-23 NOTE — Progress Notes (Signed)
2nd betamethasone injection given as prescribed.  Pt tolerated well.

## 2016-10-24 DIAGNOSIS — Z3A35 35 weeks gestation of pregnancy: Secondary | ICD-10-CM | POA: Diagnosis not present

## 2016-10-24 DIAGNOSIS — O26843 Uterine size-date discrepancy, third trimester: Secondary | ICD-10-CM | POA: Diagnosis not present

## 2016-10-28 DIAGNOSIS — O26843 Uterine size-date discrepancy, third trimester: Secondary | ICD-10-CM | POA: Diagnosis not present

## 2016-10-28 DIAGNOSIS — Z3A36 36 weeks gestation of pregnancy: Secondary | ICD-10-CM | POA: Diagnosis not present

## 2016-10-29 MED FILL — ONDANSETRON ODT 8 MG TABLET: 8 | 5 days supply | Qty: 10 | Fill #0

## 2016-11-10 DIAGNOSIS — O365931 Maternal care for other known or suspected poor fetal growth, third trimester, fetus 1: Secondary | ICD-10-CM | POA: Diagnosis not present

## 2016-11-10 DIAGNOSIS — Z3A37 37 weeks gestation of pregnancy: Secondary | ICD-10-CM | POA: Diagnosis not present

## 2016-11-12 ENCOUNTER — Telehealth (HOSPITAL_COMMUNITY): Payer: Self-pay | Admitting: *Deleted

## 2016-11-12 ENCOUNTER — Encounter (HOSPITAL_COMMUNITY): Payer: Self-pay | Admitting: *Deleted

## 2016-11-12 DIAGNOSIS — Z3A38 38 weeks gestation of pregnancy: Secondary | ICD-10-CM | POA: Diagnosis not present

## 2016-11-12 DIAGNOSIS — O26849 Uterine size-date discrepancy, unspecified trimester: Secondary | ICD-10-CM | POA: Diagnosis not present

## 2016-11-12 NOTE — Telephone Encounter (Signed)
Preadmission screen  

## 2016-11-13 NOTE — H&P (Signed)
Stephanie Prince is a 36 y.o. female G3P0111 at 85 2/7 weeks (EDD 11/25/16 by LMP c/w 10 week [place])  presenting for IOL for gestational hypertension and IUGR with EFW on [place] 11/10/16 less than 10%ile with normal AFI and dopplers.  Prenatal care complicated by some elevated BP of 150-160/80's with no lab abnormalities or proteinuria.  BP improved with stopping work.  She had preeclampsia with her first pregnancy and was delivered at 35 weeks.  OB History    Gravida Para Term Preterm AB Living   3 1   1 1 1    SAB TAB Ectopic Multiple Live Births   1     0 1    SAB x 1 NSVD 10/16 4#15oz  Past Medical History:  Diagnosis Date  . Anemia   . History of gestational hypertension   . Hx of varicella    Past Surgical History:  Procedure Laterality Date  . APPENDECTOMY    . WISDOM TOOTH EXTRACTION     Family History: family history is not on file. Social History:  reports that she has never smoked. She does not have any smokeless tobacco history on file. She reports that she does not drink alcohol or use drugs.     Maternal Diabetes: No Genetic Screening: Normal Maternal Ultrasounds/Referrals: Abnormal:  Findings:   IUGR Fetal Ultrasounds or other Referrals:  None Maternal Substance Abuse:  No Significant Maternal Medications:  None Significant Maternal Lab Results:  None Other Comments:  None  Review of Systems  Gastrointestinal: Negative for abdominal pain.  Neurological: Negative for headaches.   Maternal Medical History:  Contractions: Frequency: irregular.   Perceived severity is mild.    Fetal activity: Perceived fetal activity is normal.    Prenatal complications: PIH and IUGR.   Prenatal Complications - Diabetes: none.      Last menstrual period 02/19/2016, unknown if currently breastfeeding. Maternal Exam:  Uterine Assessment: Contraction strength is mild.  Contraction frequency is irregular.   Abdomen: Patient reports no abdominal tenderness. Fetal presentation:  vertex  Introitus: Normal vulva. Normal vagina.    Physical Exam  Constitutional: She appears well-developed.  Cardiovascular: Normal rate and regular rhythm.   Respiratory: Effort normal.  GI: Soft.  Genitourinary: Vagina normal.  Musculoskeletal: Normal range of motion.  Neurological: She is alert.  Psychiatric: She has a normal mood and affect.    Prenatal labs: ABO, Rh: O/Positive/-- (07/21 0000) Antibody: Negative (07/21 0000) Rubella: Immune (07/21 0000) RPR: Nonreactive (07/21 0000)  HBsAg: Negative (07/21 0000)  HIV: Non-reactive (07/21 0000)  GBS: Negative (01/03 0000)  First trimester screen and AFP WNL One hour GCT 81  Assessment/Plan: Pt admitted for IOL for IUGR less than 10%ile and some labile BP for last 2-3 weeks.  Plan to recheck [facility] labs and then perform AROM and pitocin.     Logan Bores 11/13/2016, 10:29 PM

## 2016-11-14 ENCOUNTER — Inpatient Hospital Stay (HOSPITAL_COMMUNITY)
Admission: RE | Admit: 2016-11-14 | Discharge: 2016-11-15 | DRG: 775 | Disposition: A | Discharge status: Home / Self Care | Payer: 59 | Source: Ambulatory Visit | Attending: Obstetrics and Gynecology | Admitting: Obstetrics and Gynecology

## 2016-11-14 ENCOUNTER — Encounter (HOSPITAL_COMMUNITY): Payer: Self-pay

## 2016-11-14 ENCOUNTER — Inpatient Hospital Stay (HOSPITAL_COMMUNITY): Payer: 59 | Admitting: Anesthesiology

## 2016-11-14 DIAGNOSIS — O134 Gestational [pregnancy-induced] hypertension without significant proteinuria, complicating childbirth: Principal | ICD-10-CM | POA: Diagnosis present

## 2016-11-14 DIAGNOSIS — Z3A38 38 weeks gestation of pregnancy: Secondary | ICD-10-CM | POA: Diagnosis not present

## 2016-11-14 DIAGNOSIS — O365930 Maternal care for other known or suspected poor fetal growth, third trimester, not applicable or unspecified: Secondary | ICD-10-CM | POA: Diagnosis present

## 2016-11-14 DIAGNOSIS — O365931 Maternal care for other known or suspected poor fetal growth, third trimester, fetus 1: Secondary | ICD-10-CM | POA: Diagnosis present

## 2016-11-14 DIAGNOSIS — Z3A00 Weeks of gestation of pregnancy not specified: Secondary | ICD-10-CM | POA: Diagnosis not present

## 2016-11-14 DIAGNOSIS — O133 Gestational [pregnancy-induced] hypertension without significant proteinuria, third trimester: Secondary | ICD-10-CM | POA: Diagnosis not present

## 2016-11-14 LAB — COMPREHENSIVE METABOLIC PANEL
ALT: 19 U/L (ref 14–54)
ANION GAP: 9 (ref 5–15)
AST: 25 U/L (ref 15–41)
Albumin: 2.9 g/dL — ABNORMAL LOW (ref 3.5–5.0)
Alkaline Phosphatase: 156 U/L — ABNORMAL HIGH (ref 38–126)
BUN: 11 mg/dL (ref 6–20)
CHLORIDE: 101 mmol/L (ref 101–111)
CO2: 23 mmol/L (ref 22–32)
Calcium: 8.5 mg/dL — ABNORMAL LOW (ref 8.9–10.3)
Creatinine, Ser: 0.59 mg/dL (ref 0.44–1.00)
Glucose, Bld: 78 mg/dL (ref 65–99)
POTASSIUM: 4.1 mmol/L (ref 3.5–5.1)
SODIUM: 133 mmol/L — AB (ref 135–145)
Total Bilirubin: 0.5 mg/dL (ref 0.3–1.2)
Total Protein: 6.9 g/dL (ref 6.5–8.1)

## 2016-11-14 LAB — RPR: RPR: NONREACTIVE

## 2016-11-14 LAB — CBC
HCT: 27.6 % — ABNORMAL LOW (ref 36.0–46.0)
HEMATOCRIT: 32.9 % — AB (ref 36.0–46.0)
Hemoglobin: 10.8 g/dL — ABNORMAL LOW (ref 12.0–15.0)
Hemoglobin: 9.3 g/dL — ABNORMAL LOW (ref 12.0–15.0)
MCH: 27.1 pg (ref 26.0–34.0)
MCH: 27.8 pg (ref 26.0–34.0)
MCHC: 32.8 g/dL (ref 30.0–36.0)
MCHC: 33.7 g/dL (ref 30.0–36.0)
MCV: 82.7 fL (ref 78.0–100.0)
MCV: 82.4 fL (ref 78.0–100.0)
PLATELETS: 195 10*3/uL (ref 150–400)
PLATELETS: 150 10*3/uL (ref 150–400)
RBC: 3.98 MIL/uL (ref 3.87–5.11)
RBC: 3.35 MIL/uL — ABNORMAL LOW (ref 3.87–5.11)
RDW: 15.1 % (ref 11.5–15.5)
RDW: 15.2 % (ref 11.5–15.5)
WBC: 7.5 10*3/uL (ref 4.0–10.5)
WBC: 9 10*3/uL (ref 4.0–10.5)

## 2016-11-14 LAB — PROTEIN / CREATININE RATIO, URINE
Creatinine, Urine: 148 mg/dL
PROTEIN CREATININE RATIO: 0.2 mg/mg{creat} — AB (ref 0.00–0.15)
Total Protein, Urine: 29 mg/dL

## 2016-11-14 LAB — TYPE AND SCREEN
ABO/RH(D): O POS
Antibody Screen: NEGATIVE

## 2016-11-14 MED ORDER — LIDOCAINE HCL (PF) 1 % IJ SOLN
INTRAMUSCULAR | Status: DC | PRN
  Administered 2016-11-14: 4 mL
  Administered 2016-11-14: 6 mL via EPIDURAL

## 2016-11-14 MED ORDER — OXYTOCIN 40 UNITS IN LACTATED RINGERS INFUSION - SIMPLE MED
1.0000 m[IU]/min | INTRAVENOUS | Status: DC
Start: 2016-11-14 — End: 2016-11-15
  Administered 2016-11-14: 2 m[IU]/min via INTRAVENOUS
  Filled 2016-11-14: qty 1000

## 2016-11-14 MED ORDER — ZOLPIDEM TARTRATE 5 MG PO TABS: 5.0000 mg | ORAL_TABLET | Freq: Every evening | ORAL | Status: DC | PRN

## 2016-11-14 MED ORDER — OXYTOCIN BOLUS FROM INFUSION
500.0000 mL | Freq: Once | INTRAVENOUS | Status: AC
  Administered 2016-11-14: 500 mL via INTRAVENOUS

## 2016-11-14 MED ORDER — COCONUT OIL OIL
1.0000 "application " | TOPICAL_OIL | Status: DC | PRN
  Administered 2016-11-15: 1 via TOPICAL
  Filled 2016-11-14: qty 120

## 2016-11-14 MED ORDER — TETANUS-DIPHTH-ACELL PERTUSSIS 5-2.5-18.5 LF-MCG/0.5 IM SUSP: 0.5000 mL | Freq: Once | INTRAMUSCULAR | Status: DC

## 2016-11-14 MED ORDER — LACTATED RINGERS IV SOLN: 500.0000 mL | Freq: Once | INTRAVENOUS | Status: DC

## 2016-11-14 MED ORDER — LIDOCAINE HCL (PF) 1 % IJ SOLN
30.0000 mL | INTRAMUSCULAR | Status: DC | PRN
  Filled 2016-11-14: qty 30

## 2016-11-14 MED ORDER — PHENYLEPHRINE 40 MCG/ML (10ML) SYRINGE FOR IV PUSH (FOR BLOOD PRESSURE SUPPORT)
80.0000 ug | PREFILLED_SYRINGE | INTRAVENOUS | Status: DC | PRN
  Administered 2016-11-14: 80 ug via INTRAVENOUS
  Filled 2016-11-14: qty 5

## 2016-11-14 MED ORDER — IBUPROFEN 600 MG PO TABS
600.0000 mg | ORAL_TABLET | Freq: Four times a day (QID) | ORAL | Status: DC
  Administered 2016-11-14 – 2016-11-15 (×3): 600 mg via ORAL
  Filled 2016-11-14 (×4): qty 1

## 2016-11-14 MED ORDER — OXYCODONE HCL 5 MG PO TABS: 5.0000 mg | ORAL_TABLET | ORAL | Status: DC | PRN

## 2016-11-14 MED ORDER — DIPHENHYDRAMINE HCL 25 MG PO CAPS: 25.0000 mg | ORAL_CAPSULE | Freq: Four times a day (QID) | ORAL | Status: DC | PRN

## 2016-11-14 MED ORDER — EPHEDRINE 5 MG/ML INJ
10.0000 mg | INTRAVENOUS | Status: DC | PRN
  Filled 2016-11-14: qty 4

## 2016-11-14 MED ORDER — OXYCODONE-ACETAMINOPHEN 5-325 MG PO TABS: 2.0000 | ORAL_TABLET | ORAL | Status: DC | PRN

## 2016-11-14 MED ORDER — OXYCODONE-ACETAMINOPHEN 5-325 MG PO TABS: 1.0000 | ORAL_TABLET | ORAL | Status: DC | PRN

## 2016-11-14 MED ORDER — BENZOCAINE-MENTHOL 20-0.5 % EX AERO: 1.0000 "application " | INHALATION_SPRAY | CUTANEOUS | Status: DC | PRN

## 2016-11-14 MED ORDER — LACTATED RINGERS IV SOLN: 500.0000 mL | INTRAVENOUS | Status: DC | PRN

## 2016-11-14 MED ORDER — SENNOSIDES-DOCUSATE SODIUM 8.6-50 MG PO TABS
2.0000 | ORAL_TABLET | ORAL | Status: DC
  Administered 2016-11-14: 2 via ORAL
  Filled 2016-11-14: qty 2

## 2016-11-14 MED ORDER — PHENYLEPHRINE 40 MCG/ML (10ML) SYRINGE FOR IV PUSH (FOR BLOOD PRESSURE SUPPORT)
80.0000 ug | PREFILLED_SYRINGE | INTRAVENOUS | Status: DC | PRN
  Filled 2016-11-14: qty 10
  Filled 2016-11-14: qty 5

## 2016-11-14 MED ORDER — ACETAMINOPHEN 325 MG PO TABS: 650.0000 mg | ORAL_TABLET | ORAL | Status: DC | PRN

## 2016-11-14 MED ORDER — FENTANYL 2.5 MCG/ML BUPIVACAINE 1/10 % EPIDURAL INFUSION (WH - ANES)
14.0000 mL/h | INTRAMUSCULAR | Status: DC | PRN
  Administered 2016-11-14: 14 mL/h via EPIDURAL
  Filled 2016-11-14: qty 100

## 2016-11-14 MED ORDER — PRENATAL MULTIVITAMIN CH
1.0000 | ORAL_TABLET | Freq: Every day | ORAL | Status: DC
  Filled 2016-11-14: qty 1

## 2016-11-14 MED ORDER — OXYCODONE HCL 5 MG PO TABS: 10.0000 mg | ORAL_TABLET | ORAL | Status: DC | PRN

## 2016-11-14 MED ORDER — SIMETHICONE 80 MG PO CHEW: 80.0000 mg | CHEWABLE_TABLET | ORAL | Status: DC | PRN

## 2016-11-14 MED ORDER — OXYTOCIN 40 UNITS IN LACTATED RINGERS INFUSION - SIMPLE MED
2.5000 [IU]/h | INTRAVENOUS | Status: DC
  Administered 2016-11-14: 2.5 [IU]/h via INTRAVENOUS

## 2016-11-14 MED ORDER — ONDANSETRON HCL 4 MG/2ML IJ SOLN: 4.0000 mg | INTRAMUSCULAR | Status: DC | PRN

## 2016-11-14 MED ORDER — ONDANSETRON HCL 4 MG PO TABS: 4.0000 mg | ORAL_TABLET | ORAL | Status: DC | PRN

## 2016-11-14 MED ORDER — BUTORPHANOL TARTRATE 1 MG/ML IJ SOLN: 1.0000 mg | INTRAMUSCULAR | Status: DC | PRN

## 2016-11-14 MED ORDER — DIPHENHYDRAMINE HCL 50 MG/ML IJ SOLN: 12.5000 mg | INTRAMUSCULAR | Status: DC | PRN

## 2016-11-14 MED ORDER — TERBUTALINE SULFATE 1 MG/ML IJ SOLN
0.2500 mg | Freq: Once | INTRAMUSCULAR | Status: DC | PRN
  Filled 2016-11-14: qty 1

## 2016-11-14 MED ORDER — SOD CITRATE-CITRIC ACID 500-334 MG/5ML PO SOLN: 30.0000 mL | ORAL | Status: DC | PRN

## 2016-11-14 MED ORDER — WITCH HAZEL-GLYCERIN EX PADS: 1.0000 "application " | MEDICATED_PAD | CUTANEOUS | Status: DC | PRN

## 2016-11-14 MED ORDER — LACTATED RINGERS IV SOLN
INTRAVENOUS | Status: DC
  Administered 2016-11-14: 08:00:00 via INTRAVENOUS

## 2016-11-14 MED ORDER — DIBUCAINE 1 % RE OINT: 1.0000 "application " | TOPICAL_OINTMENT | RECTAL | Status: DC | PRN

## 2016-11-14 MED ORDER — ONDANSETRON HCL 4 MG/2ML IJ SOLN
4.0000 mg | Freq: Four times a day (QID) | INTRAMUSCULAR | Status: DC | PRN
  Administered 2016-11-14: 4 mg via INTRAVENOUS
  Filled 2016-11-14: qty 2

## 2016-11-14 NOTE — Anesthesia Procedure Notes (Signed)
Epidural Patient location during procedure: OB  Staffing Anesthesiologist: Aleisha Paone  Preanesthetic Checklist Completed: patient identified, site marked, surgical consent, pre-op evaluation, timeout performed, IV checked, risks and benefits discussed and monitors and equipment checked  Epidural Patient position: sitting Prep: site prepped and draped and DuraPrep Patient monitoring: continuous pulse ox and blood pressure Approach: midline Location: L3-L4 Injection technique: LOR air  Needle:  Needle type: Tuohy  Needle gauge: 17 G Needle length: 9 cm and 9 Needle insertion depth: 5 cm cm Catheter type: closed end flexible Catheter size: 19 Gauge Catheter at skin depth: 10 cm Test dose: negative  Assessment Events: blood not aspirated, injection not painful, no injection resistance, negative IV test and no paresthesia  Additional Notes Dosing of Epidural:  1st dose, through catheter .............................................  Xylocaine 40 mg  2nd dose, through catheter, after waiting 3 minutes.........Xylocaine 60 mg    As each dose occurred, patient was free of IV sx; and patient exhibited no evidence of SA injection.  Patient is more comfortable after epidural dosed. Please see RN's note for documentation of vital signs,and FHR which are stable.  Patient reminded not to try to ambulate with numb legs, and that an RN must be present when she attempts to get up.        

## 2016-11-14 NOTE — Anesthesia Preprocedure Evaluation (Signed)
Anesthesia Evaluation  Patient identified by MRN, date of birth, ID band Patient awake    Reviewed: Allergy & Precautions, H&P , Patient's Chart, lab work & pertinent test results  Airway Mallampati: II TM Distance: >3 FB Neck ROM: full    Dental  (+) Teeth Intact   Pulmonary  breath sounds clear to auscultation        Cardiovascular hypertension, Rhythm:regular Rate:Normal     Neuro/Psych    GI/Hepatic   Endo/Other    Renal/GU      Musculoskeletal   Abdominal   Peds  Hematology   Anesthesia Other Findings       Reproductive/Obstetrics (+) Pregnancy                          Anesthesia Physical Anesthesia Plan  ASA: II  Anesthesia Plan: Epidural   Post-op Pain Management:    Induction:   Airway Management Planned:   Additional Equipment:   Intra-op Plan:   Post-operative Plan:   Informed Consent: I have reviewed the patients History and Physical, chart, labs and discussed the procedure including the risks, benefits and alternatives for the proposed anesthesia with the patient or authorized representative who has indicated his/her understanding and acceptance.   Dental Advisory Given  Plan Discussed with:   Anesthesia Plan Comments: (Labs checked- platelets confirmed with RN in room. Fetal heart tracing, per RN, reported to be stable enough for sitting procedure. Discussed epidural, and patient consents to the procedure:  included risk of possible headache,backache, failed block, allergic reaction, and nerve injury. This patient was asked if she had any questions or concerns before the procedure started.)        Anesthesia Quick Evaluation  

## 2016-11-14 NOTE — Lactation Note (Signed)
This note was copied from a baby's chart. Lactation Consultation Note  Patient Name: Girl Ailani Fahim [id number] Date: 11/14/2016 Reason for consult: Initial assessment   Initial assessment with Exp BF mom of 1 hour old infant. Mom BF her 47 month old for 9 months. Maternal history of PIH and IUGR. Infant weight 6 lb 3 oz. 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  Infant being held by FOB, infant fed for 30 minutes per mom and RN. Mom reports she fed well.  Enc mom to feed infant at least every 3 hours or more often as infant desires. Mom reports she has plenty of colostrum, enc mom to hand express and spoon feed infant post BF or before BF if infant sleepy. Enc parents to keep infant STS as much as possible and to limit stimulation to infant and to keep her hat on to minimize caloric expenditure as infant will most likely be < 6 lb during her hospital stay. Mom voiced understanding.  Feeding log given with instructions for use.   BF Resources Handout and [place] Brochure given, informed mom of IP/OP Services, BF Support Groups and [place] phone #. Enc mom to call out to desk for feeding assistance as needed. Mom without questions/concerns at this time.         Maternal Data Formula Feeding for Exclusion: No Has patient been taught Hand Expression?: Yes Does the patient have breastfeeding experience prior to this delivery?: Yes  Feeding Feeding Type: Breast Fed Length of feed: 30 min  LATCH Score/Interventions Latch: Grasps breast easily, tongue down, lips flanged, rhythmical sucking.  Audible Swallowing: Spontaneous and intermittent  Type of Nipple: Everted at rest and after stimulation  Comfort (Breast/Nipple): Soft / non-tender     Hold (Positioning): No assistance needed to correctly position infant at breast.  LATCH Score: 10  Lactation Tools Discussed/Used WIC Program: No   Consult Status Consult Status: Follow-up Date: 11/15/16 Follow-up type: In-patient    Debby Freiberg Daiki Dicostanzo 11/14/2016, 12:40  PM

## 2016-11-14 NOTE — Progress Notes (Signed)
Patient ID: Stephanie Prince, female   DOB: 01-12-1981, 36 y.o.   MRN: [id number] Pt admitted and mild/mod contractions on pitocin  Category 1 FHR tracing  Cervix 4/70/-2  AROM clear  Follow progress and epidural prn

## 2016-11-14 NOTE — Anesthesia Postprocedure Evaluation (Signed)
Anesthesia Post Note  Patient: Khadijha Garraway  Procedure(s) Performed: * No procedures listed *  Patient location during evaluation: Mother Baby Anesthesia Type: Epidural Level of consciousness: awake and alert, oriented and patient cooperative Pain management: pain level controlled Vital Signs Assessment: post-procedure vital signs reviewed and stable Respiratory status: spontaneous breathing Cardiovascular status: stable Postop Assessment: no headache, epidural receding, patient able to bend at knees and no signs of nausea or vomiting Anesthetic complications: no        Last Vitals:  Vitals:   11/14/16 1231 11/14/16 1258  BP: 126/82 (!) 136/92  Pulse: 65 78  Resp: 16 18  Temp: 36.9 C 36.7 C    Last Pain:  Vitals:   11/14/16 1258  TempSrc: Oral  PainSc: 0-No pain   Pain Goal:                 [facility]

## 2016-11-15 LAB — CBC
HEMATOCRIT: 26.4 % — AB (ref 36.0–46.0)
HEMOGLOBIN: 8.8 g/dL — AB (ref 12.0–15.0)
MCH: 27.8 pg (ref 26.0–34.0)
MCHC: 33.3 g/dL (ref 30.0–36.0)
MCV: 83.3 fL (ref 78.0–100.0)
Platelets: 146 10*3/uL — ABNORMAL LOW (ref 150–400)
RBC: 3.17 MIL/uL — ABNORMAL LOW (ref 3.87–5.11)
RDW: 15.5 % (ref 11.5–15.5)
WBC: 8.4 10*3/uL (ref 4.0–10.5)

## 2016-11-15 MED ORDER — IBUPROFEN 600 MG PO TABS: 600.0000 mg | ORAL_TABLET | Freq: Four times a day (QID) | ORAL | 0 refills | Status: DC

## 2016-11-15 NOTE — Discharge Summary (Signed)
OB Discharge Summary     Patient Name: Stephanie Prince DOB: Mar 14, 1981 MRN: [id number]  Date of admission: 11/14/2016 Delivering MD: Paula Compton   Date of discharge: 11/15/2016  Admitting diagnosis: INDUCTION Intrauterine pregnancy: [redacted]w[redacted]d     Secondary diagnosis:  Active Problems:   IUGR (intrauterine growth restriction) affecting care of mother, third trimester, fetus 1   NSVD (normal spontaneous vaginal delivery)  Additional problems: none     Discharge diagnosis: Term Pregnancy Delivered                                                                                                Post partum procedures:none  Augmentation: AROM and Pitocin  Complications: None  Hospital course:  Induction of Labor With Vaginal Delivery   36 y.o. yo [id number] at [redacted]w[redacted]d was admitted to the hospital 11/14/2016 for induction of labor.  Indication for induction: Gestational hypertension and IUGR.  Patient had an uncomplicated labor course as follows: Membrane Rupture Time/Date: 9:30 AM ,11/14/2016   Intrapartum Procedures: Episiotomy: None [1]                                         Lacerations:  None [1]  Patient had delivery of a Viable infant.  Information for the patient's newborn:  Vila, Larr [id number]  Delivery Method: Vaginal, Spontaneous Delivery (Filed from Delivery Summary)   11/14/2016  Details of delivery can be found in separate delivery note.  Patient had a routine postpartum course. Patient is discharged home 11/15/16.  Physical exam  Vitals:   11/14/16 1356 11/14/16 1530 11/14/16 2123 11/15/16 0613  BP: 126/77 126/77 121/74 115/69  Pulse: 63 63 65 60  Resp: 18 18 18 18   Temp: 98.3 F (36.8 C) 98.3 F (36.8 C) 98 F (36.7 C) 97.7 F (36.5 C)  TempSrc: Oral Oral Oral Oral  SpO2:      Weight:      Height:       General: alert and cooperative Lochia: appropriate Uterine Fundus: firm  Labs: Lab Results  Component Value Date   WBC 8.4 11/15/2016   HGB  8.8 (L) 11/15/2016   HCT 26.4 (L) 11/15/2016   MCV 83.3 11/15/2016   PLT 146 (L) 11/15/2016   CMP Latest Ref Rng & Units 11/14/2016  Glucose 65 - 99 mg/dL 78  BUN 6 - 20 mg/dL 11  Creatinine 0.44 - 1.00 mg/dL 0.59  Sodium 135 - 145 mmol/L 133(L)  Potassium 3.5 - 5.1 mmol/L 4.1  Chloride 101 - 111 mmol/L 101  CO2 22 - 32 mmol/L 23  Calcium 8.9 - 10.3 mg/dL 8.5(L)  Total Protein 6.5 - 8.1 g/dL 6.9  Total Bilirubin 0.3 - 1.2 mg/dL 0.5  Alkaline Phos 38 - 126 U/L 156(H)  AST 15 - 41 U/L 25  ALT 14 - 54 U/L 19    Discharge instruction: per After Visit Summary and "Baby and Me Booklet".  After visit meds:  Allergies as of 11/15/2016   No Known Allergies  Medication List    STOP taking these medications   aspirin EC 81 MG tablet   calcium carbonate 500 MG chewable tablet Commonly known as:  TUMS - dosed in mg elemental calcium   ondansetron 8 MG disintegrating tablet Commonly known as:  ZOFRAN-ODT     TAKE these medications   ibuprofen 600 MG tablet Commonly known as:  ADVIL,MOTRIN Take 1 tablet (600 mg total) by mouth every 6 (six) hours.   prenatal vitamin w/FE, FA 29-1 MG Chew chewable tablet Chew 2 tablets by mouth daily.       Diet: routine diet  Activity: Advance as tolerated. Pelvic rest for 6 weeks.   Outpatient follow up:6 weeks Follow up Appt:No future appointments. Follow up Visit:No Follow-up on file.  Postpartum contraception: IUD Mirena  Newborn Data: Live born female  Birth Weight: 6 lb 3 oz (2807 g) APGAR: 8, 9  Baby Feeding: Breast Disposition:home with mother   11/15/2016 Logan Bores, MD

## 2016-11-15 NOTE — Progress Notes (Signed)
Post Partum Day 1 Subjective: no complaints, up ad lib and tolerating PO Requesting early d/c  Objective: Blood pressure 115/69, pulse 60, temperature 97.7 F (36.5 C), temperature source Oral, resp. rate 18, height 5\' 4"  (1.626 m), weight 72.1 kg (159 lb), last menstrual period 02/19/2016, SpO2 97 %, unknown if currently breastfeeding.  Physical Exam:  General: alert and cooperative Lochia: appropriate Uterine Fundus: firm    Recent Labs  11/14/16 1150 11/15/16 0542  HGB 9.3* 8.8*  HCT 27.6* 26.4*    Assessment/Plan: Discharge home if baby able to go   LOS: 1 day   Stephanie Prince W 11/15/2016, 9:24 AM

## 2016-11-15 NOTE — Lactation Note (Addendum)
This note was copied from a baby's chart. Lactation Consultation Note  Patient Name: Stephanie Prince [id number] Date: 11/15/2016   Baby 25 hours.  P2, < 6 lbs. Early discharge.  Mother states it pinches when she breastfeeds.  Offered to view latch, family wants to be discharged.  Mother MAU RN and states baby has labial frenulum tightness.  LC flanged upper lip and upper gum did blanch.  LC was able to freely sweep finger under tongue. Mother states baby has small mouth.  Mother has 2 small blisters on R nipple and the beginning of an abrasion on the tip of L nipple. She is applying ebm and coconut oil.  Suggest wearing shells to prevent rubbing and mother states she has shells at home. Mother also asked about using a nipple shield which she has at home also.  Provided instruction on if using NS to be sure to post pump 4-5 times a day and give volume back to baby. Provided family with resource sheets and told to discuss with Pediatrician.  Encouraged tummy time. Discussed if baby is too sleepy or mother is too sore to breastfeed then mother should pump w/ her personal DEBP and give baby volume pumped. Mom encouraged to feed baby 8-12 times/24 hours and with feeding cues at least every 3 hours.  Reviewed engorgement care and monitoring voids/stools.  Family called to view latch. Assisted w/ flanging upper lip due to mother's complaint of pinching. Upper lip briefly stays flanged and then goes back to tightness.  Attempted latching in football, laid back and cross cradle. All positions mother felt pinching. Sucks and swallows observed. Baby eager to feed. Demonstrated some upper frenulum stretching exercises and referred her to [employer]. Encouraged mother stating that it may improve with time or she should discuss with Pediatriaican.  Suggest pumping to protect milk supply if too painful. OP appt made for 2/1.         Maternal Data    Feeding    LATCH Score/Interventions                       Lactation Tools Discussed/Used     Consult Status      [name] 11/15/2016, 12:57 PM

## 2016-11-20 ENCOUNTER — Ambulatory Visit (HOSPITAL_COMMUNITY): Admit: 2016-11-20 | Payer: 59

## 2016-12-22 DIAGNOSIS — Z1389 Encounter for screening for other disorder: Secondary | ICD-10-CM | POA: Diagnosis not present

## 2016-12-22 DIAGNOSIS — Z3009 Encounter for other general counseling and advice on contraception: Secondary | ICD-10-CM | POA: Diagnosis not present

## 2017-01-07 DIAGNOSIS — Z30430 Encounter for insertion of intrauterine contraceptive device: Secondary | ICD-10-CM | POA: Diagnosis not present

## 2017-01-07 DIAGNOSIS — Z3202 Encounter for pregnancy test, result negative: Secondary | ICD-10-CM | POA: Diagnosis not present

## 2017-04-27 DIAGNOSIS — H5213 Myopia, bilateral: Secondary | ICD-10-CM | POA: Diagnosis not present

## 2017-10-06 DIAGNOSIS — Z0000 Encounter for general adult medical examination without abnormal findings: Secondary | ICD-10-CM | POA: Diagnosis not present

## 2018-02-24 DIAGNOSIS — Z1389 Encounter for screening for other disorder: Secondary | ICD-10-CM | POA: Diagnosis not present

## 2018-02-24 DIAGNOSIS — Z130 Encounter for screening for diseases of the blood and blood-forming organs and certain disorders involving the immune mechanism: Secondary | ICD-10-CM | POA: Diagnosis not present

## 2018-02-24 DIAGNOSIS — Z01419 Encounter for gynecological examination (general) (routine) without abnormal findings: Secondary | ICD-10-CM | POA: Diagnosis not present

## 2018-02-24 DIAGNOSIS — Z30432 Encounter for removal of intrauterine contraceptive device: Secondary | ICD-10-CM | POA: Diagnosis not present

## 2018-02-24 DIAGNOSIS — Z6823 Body mass index (BMI) 23.0-23.9, adult: Secondary | ICD-10-CM | POA: Diagnosis not present

## 2018-04-29 DIAGNOSIS — H5213 Myopia, bilateral: Secondary | ICD-10-CM | POA: Diagnosis not present

## 2018-05-26 DIAGNOSIS — D1801 Hemangioma of skin and subcutaneous tissue: Secondary | ICD-10-CM | POA: Diagnosis not present

## 2018-05-26 DIAGNOSIS — L820 Inflamed seborrheic keratosis: Secondary | ICD-10-CM | POA: Diagnosis not present

## 2018-05-26 DIAGNOSIS — D225 Melanocytic nevi of trunk: Secondary | ICD-10-CM | POA: Diagnosis not present

## 2018-05-26 DIAGNOSIS — L578 Other skin changes due to chronic exposure to nonionizing radiation: Secondary | ICD-10-CM | POA: Diagnosis not present

## 2018-07-28 MED FILL — PROMETHAZINE 25 MG TABLET: 25 | 5 days supply | Qty: 20 | Fill #0

## 2018-08-03 DIAGNOSIS — N911 Secondary amenorrhea: Secondary | ICD-10-CM | POA: Diagnosis not present

## 2018-08-03 DIAGNOSIS — Z3201 Encounter for pregnancy test, result positive: Secondary | ICD-10-CM | POA: Diagnosis not present

## 2018-08-12 DIAGNOSIS — Z3A09 9 weeks gestation of pregnancy: Secondary | ICD-10-CM | POA: Diagnosis not present

## 2018-08-12 DIAGNOSIS — O26891 Other specified pregnancy related conditions, first trimester: Secondary | ICD-10-CM | POA: Diagnosis not present

## 2018-08-13 DIAGNOSIS — O09521 Supervision of elderly multigravida, first trimester: Secondary | ICD-10-CM | POA: Diagnosis not present

## 2018-08-13 DIAGNOSIS — Z113 Encounter for screening for infections with a predominantly sexual mode of transmission: Secondary | ICD-10-CM | POA: Diagnosis not present

## 2018-08-13 DIAGNOSIS — Z3689 Encounter for other specified antenatal screening: Secondary | ICD-10-CM | POA: Diagnosis not present

## 2018-08-13 DIAGNOSIS — Z3A09 9 weeks gestation of pregnancy: Secondary | ICD-10-CM | POA: Diagnosis not present

## 2018-08-13 DIAGNOSIS — Z8759 Personal history of other complications of pregnancy, childbirth and the puerperium: Secondary | ICD-10-CM | POA: Diagnosis not present

## 2018-08-13 LAB — OB RESULTS CONSOLE ABO/RH: RH Type: POSITIVE

## 2018-08-13 LAB — OB RESULTS CONSOLE HIV ANTIBODY (ROUTINE TESTING): HIV: NONREACTIVE

## 2018-08-13 LAB — OB RESULTS CONSOLE GC/CHLAMYDIA
Chlamydia: NEGATIVE
Gonorrhea: NEGATIVE

## 2018-08-13 LAB — OB RESULTS CONSOLE ANTIBODY SCREEN: Antibody Screen: NEGATIVE

## 2018-08-13 LAB — OB RESULTS CONSOLE RUBELLA ANTIBODY, IGM: Rubella: IMMUNE

## 2018-08-13 LAB — OB RESULTS CONSOLE RPR: RPR: NONREACTIVE

## 2018-08-13 LAB — OB RESULTS CONSOLE HEPATITIS B SURFACE ANTIGEN: Hepatitis B Surface Ag: NEGATIVE

## 2018-10-15 ENCOUNTER — Other Ambulatory Visit: Payer: Self-pay | Admitting: Obstetrics and Gynecology

## 2018-10-15 DIAGNOSIS — R2231 Localized swelling, mass and lump, right upper limb: Secondary | ICD-10-CM

## 2018-10-20 NOTE — L&D Delivery Note (Signed)
Delivery Note At 1:05 PM a healthy female was delivered via Vaginal, Spontaneous (Presentation:ROA ).  APGAR: 8, 9; weight pending .   Placenta status: delivered spontaneously .  Cord:  with the following complications: tight nuchal delivered through.   Anesthesia:  epidural Episiotomy: None Lacerations: None Suture Repair: n/a Est. Blood Loss (mL): 10mL  Mom to postpartum.  Baby to Couplet care / Skin to Skin.  Logan Bores 03/01/2019, 1:23 PM

## 2018-10-21 ENCOUNTER — Ambulatory Visit
Admission: RE | Admit: 2018-10-21 | Discharge: 2018-10-21 | Disposition: A | Discharge status: Home / Self Care | Payer: 59 | Source: Ambulatory Visit | Attending: Obstetrics and Gynecology | Admitting: Obstetrics and Gynecology

## 2018-10-21 DIAGNOSIS — R2231 Localized swelling, mass and lump, right upper limb: Secondary | ICD-10-CM

## 2018-11-05 ENCOUNTER — Encounter: Payer: Self-pay | Admitting: Family Medicine

## 2018-11-05 ENCOUNTER — Ambulatory Visit (INDEPENDENT_AMBULATORY_CARE_PROVIDER_SITE_OTHER): Payer: No Typology Code available for payment source | Admitting: Family Medicine

## 2018-11-05 VITALS — BP 102/68 | HR 82 | Temp 98.0°F | Ht 64.0 in | Wt 151.1 lb

## 2018-11-05 DIAGNOSIS — Z7689 Persons encountering health services in other specified circumstances: Secondary | ICD-10-CM | POA: Diagnosis not present

## 2018-11-05 DIAGNOSIS — Z3A21 21 weeks gestation of pregnancy: Secondary | ICD-10-CM

## 2018-11-05 NOTE — Progress Notes (Signed)
Chief Complaint  Patient presents with  . New Patient (Initial Visit)       New Patient Visit SUBJECTIVE: HPI: Stephanie Prince is an 38 y.o.female who is being seen for establishing care.  The patient was previously seen at another office until ins changed.  She is currently [redacted] weeks pregnant.  She does feel fatigued, but also has 2 small children at home.  She is taking a baby aspirin at the direction of her obstetrician due to history of gestational hypertension and preeclampsia.  She received her tetanus shot during her second pregnancy around 2 years ago.  She follows with GYN for her women's health.   No Known Allergies  Past Medical History:  Diagnosis Date  . Anemia   . History of gestational hypertension   . Hx of varicella    Past Surgical History:  Procedure Laterality Date  . APPENDECTOMY    . WISDOM TOOTH EXTRACTION     Family History  Problem Relation Age of Onset  . Hypertension Mother   . Hypertension Maternal Grandmother   . Heart failure Maternal Grandmother   . Alcohol abuse Maternal Grandfather   . Cancer Paternal Grandmother        prostate cancer   No Known Allergies  Current Outpatient Medications:  .  aspirin EC 81 MG tablet, Take 81 mg by mouth daily., Disp: , Rfl:  .  prenatal vitamin w/FE, FA (NATACHEW) 29-1 MG CHEW chewable tablet, Chew 2 tablets by mouth daily. , Disp: , Rfl:   ROS Endocrine: + Fatigue   OBJECTIVE: BP 102/68 (BP Location: Left Arm, Patient Position: Sitting, Cuff Size: Normal)   Pulse 82   Temp 98 F (36.7 C) (Oral)   Ht 5\' 4"  (1.626 m)   Wt 151 lb 2 oz (68.5 kg)   SpO2 98%   BMI 25.94 kg/m   Constitutional: -  VS reviewed -  Well developed, well nourished, appears stated age -  No apparent distress  Psychiatric: -  Oriented to person, place, and time -  Memory intact -  Affect and mood normal -  Fluent conversation, good eye contact -  Judgment and insight age appropriate  Eye: -  Conjunctivae clear, no  discharge -  Pupils symmetric, round, reactive to light  ENMT: -  MMM    Pharynx moist, no exudate, no erythema  Cardiovascular: -  RRR  Respiratory: -  Normal respiratory effort, no accessory muscle use, no retraction -  Breath sounds equal, no wheezes, no ronchi, no crackles  Skin: -  No significant lesion on inspection -  Warm and dry to palpation   ASSESSMENT/PLAN: Establishing care with new doctor, encounter for  [redacted] weeks gestation of pregnancy  Patient appears quite healthy. Patient should return at her earliest convenience for a physical. The patient voiced understanding and agreement to the plan.   [place], DO 11/05/18  11:53 AM

## 2018-11-05 NOTE — Progress Notes (Signed)
Pre visit review using our clinic review tool, if applicable. No additional management support is needed unless otherwise documented below in the visit note. 

## 2018-11-05 NOTE — Patient Instructions (Signed)
Let us know if you need anything.  

## 2018-11-19 ENCOUNTER — Encounter: Payer: Self-pay | Admitting: Family Medicine

## 2018-11-19 ENCOUNTER — Other Ambulatory Visit: Payer: Self-pay | Admitting: Family Medicine

## 2018-11-19 DIAGNOSIS — Z0000 Encounter for general adult medical examination without abnormal findings: Secondary | ICD-10-CM

## 2018-12-05 ENCOUNTER — Encounter: Payer: Self-pay | Admitting: Family Medicine

## 2019-02-16 LAB — OB RESULTS CONSOLE GBS: GBS: POSITIVE

## 2019-02-24 ENCOUNTER — Encounter (HOSPITAL_COMMUNITY): Payer: Self-pay | Admitting: *Deleted

## 2019-02-24 ENCOUNTER — Telehealth (HOSPITAL_COMMUNITY): Payer: Self-pay | Admitting: *Deleted

## 2019-02-24 NOTE — Telephone Encounter (Signed)
Preadmission screen  

## 2019-02-25 ENCOUNTER — Encounter (HOSPITAL_COMMUNITY): Payer: Self-pay | Admitting: *Deleted

## 2019-02-25 ENCOUNTER — Telehealth (HOSPITAL_COMMUNITY): Payer: Self-pay | Admitting: *Deleted

## 2019-02-25 NOTE — Telephone Encounter (Signed)
Preadmission screen  

## 2019-02-28 ENCOUNTER — Other Ambulatory Visit: Payer: Self-pay | Admitting: Obstetrics and Gynecology

## 2019-02-28 NOTE — H&P (Deleted)
  The note originally documented on this encounter has been moved the the encounter in which it belongs.  

## 2019-02-28 NOTE — H&P (Signed)
Stephanie Prince is a 38 y.o. female [id number] at 15 0/7 weeks (EDD 03/15/19 by LMP c/w 9 week [place]) presenting for IOL with elevating BP to 1330-140/90's over last several days.  No PIH sx and labs WNL.   Prenatal care significant for h/o preeclampsia with first pregnancy and on baby ASA q day this pregnancy.  She is AMA with a low risk panorama.  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  OB History    Gravida  4   Para  2   Term  1   Preterm  1   AB  1   Living  2     SAB  1   TAB      Ectopic      Multiple  0   Live Births  2         SAB x1 2016 NSVD Preeclampsia 4#15oz 2018 NSVD 6lb 3oz Past Medical History:  Diagnosis Date  . Anemia   . History of gestational hypertension   . Hx of varicella    Past Surgical History:  Procedure Laterality Date  . APPENDECTOMY    . WISDOM TOOTH EXTRACTION     Family History: family history includes Alcohol abuse in her maternal grandfather; Breast cancer in her maternal aunt; Cancer in her maternal aunt, maternal uncle, and paternal grandfather; Heart failure in her maternal grandmother; Hypertension in her maternal grandmother and mother; Other in her father. Social History:  reports that she has never smoked. She has never used smokeless tobacco. She reports that she does not drink alcohol or use drugs.     Maternal Diabetes: No Genetic Screening: Normal Maternal Ultrasounds/Referrals: Normal Fetal Ultrasounds or other Referrals:  None Maternal Substance Abuse:  No Significant Maternal Medications:  None Significant Maternal Lab Results:  Lab values include: Group B Strep positive Other Comments:  None  Review of Systems  Constitutional: Negative for fever.  Eyes: Negative for blurred vision.  Gastrointestinal: Negative for abdominal pain and heartburn.  Neurological: Negative for headaches.   Maternal Medical History:  Contractions: Frequency: irregular.   Perceived severity is mild.    Fetal activity: Perceived fetal activity is  normal.    Prenatal complications: PIH.   Prenatal Complications - Diabetes: none.      Last menstrual period 06/08/2018, unknown if currently breastfeeding. Maternal Exam:  Uterine Assessment: Contraction strength is mild.  Contraction frequency is irregular.   Abdomen: Patient reports no abdominal tenderness. Fetal presentation: vertex  Introitus: Normal vulva. Normal vagina.    Physical Exam  Constitutional: She appears well-developed.  Cardiovascular: Normal rate and regular rhythm.  Respiratory: Effort normal.  GI: Soft.  Genitourinary:    Vulva and vagina normal.   Neurological: She is alert.  Psychiatric: She has a normal mood and affect.    Prenatal labs: ABO, Rh: O/Positive/-- (10/25 0000) Antibody: Negative (10/25 0000) Rubella: Immune (10/25 0000) RPR: Nonreactive (10/25 0000)  HBsAg: Negative (10/25 0000)  HIV: Non-reactive (10/25 0000)  GBS: Positive (04/29 0000)  One hour GCT 120 CF carrier negative  Assessment/Plan: Pt with gestational hypertension at term.  Will do PCN for +GBS and Pitocin AROM.   Logan Bores 02/28/2019, 10:18 PM

## 2019-03-01 ENCOUNTER — Inpatient Hospital Stay (HOSPITAL_COMMUNITY)
Admission: AD | Admit: 2019-03-01 | Discharge: 2019-03-03 | DRG: 807 | Disposition: A | Discharge status: Home / Self Care | Payer: No Typology Code available for payment source | Attending: Obstetrics and Gynecology | Admitting: Obstetrics and Gynecology

## 2019-03-01 ENCOUNTER — Inpatient Hospital Stay (HOSPITAL_COMMUNITY): Payer: No Typology Code available for payment source | Admitting: Anesthesiology

## 2019-03-01 ENCOUNTER — Inpatient Hospital Stay (HOSPITAL_COMMUNITY): Payer: No Typology Code available for payment source

## 2019-03-01 ENCOUNTER — Encounter (HOSPITAL_COMMUNITY): Payer: Self-pay | Admitting: *Deleted

## 2019-03-01 ENCOUNTER — Other Ambulatory Visit: Payer: Self-pay

## 2019-03-01 DIAGNOSIS — O99824 Streptococcus B carrier state complicating childbirth: Secondary | ICD-10-CM | POA: Diagnosis present

## 2019-03-01 DIAGNOSIS — D649 Anemia, unspecified: Secondary | ICD-10-CM | POA: Diagnosis present

## 2019-03-01 DIAGNOSIS — O9902 Anemia complicating childbirth: Secondary | ICD-10-CM | POA: Diagnosis present

## 2019-03-01 DIAGNOSIS — O26893 Other specified pregnancy related conditions, third trimester: Secondary | ICD-10-CM | POA: Diagnosis present

## 2019-03-01 DIAGNOSIS — Z3A38 38 weeks gestation of pregnancy: Secondary | ICD-10-CM

## 2019-03-01 DIAGNOSIS — O133 Gestational [pregnancy-induced] hypertension without significant proteinuria, third trimester: Secondary | ICD-10-CM | POA: Diagnosis present

## 2019-03-01 DIAGNOSIS — O134 Gestational [pregnancy-induced] hypertension without significant proteinuria, complicating childbirth: Principal | ICD-10-CM | POA: Diagnosis present

## 2019-03-01 LAB — COMPREHENSIVE METABOLIC PANEL
ALT: 14 U/L (ref 0–44)
AST: 23 U/L (ref 15–41)
Albumin: 2.7 g/dL — ABNORMAL LOW (ref 3.5–5.0)
Alkaline Phosphatase: 160 U/L — ABNORMAL HIGH (ref 38–126)
Anion gap: 12 (ref 5–15)
BUN: 6 mg/dL (ref 6–20)
CO2: 21 mmol/L — ABNORMAL LOW (ref 22–32)
Calcium: 8.4 mg/dL — ABNORMAL LOW (ref 8.9–10.3)
Chloride: 101 mmol/L (ref 98–111)
Creatinine, Ser: 0.69 mg/dL (ref 0.44–1.00)
GFR calc Af Amer: >60 mL/min (ref >60)
GFR calc non Af Amer: >60 mL/min (ref >60)
Glucose, Bld: 82 mg/dL (ref 70–99)
Potassium: 4.1 mmol/L (ref 3.5–5.1)
Sodium: 134 mmol/L — ABNORMAL LOW (ref 135–145)
Total Bilirubin: 0.6 mg/dL (ref 0.3–1.2)
Total Protein: 6.4 g/dL — ABNORMAL LOW (ref 6.5–8.1)

## 2019-03-01 LAB — CBC
HCT: 35.2 % — ABNORMAL LOW (ref 36.0–46.0)
HCT: 32.2 % — ABNORMAL LOW (ref 36.0–46.0)
Hemoglobin: 11.4 g/dL — ABNORMAL LOW (ref 12.0–15.0)
Hemoglobin: 10.6 g/dL — ABNORMAL LOW (ref 12.0–15.0)
MCH: 28.4 pg (ref 26.0–34.0)
MCH: 28.4 pg (ref 26.0–34.0)
MCHC: 32.4 g/dL (ref 30.0–36.0)
MCHC: 32.9 g/dL (ref 30.0–36.0)
MCV: 87.6 fL (ref 80.0–100.0)
MCV: 86.3 fL (ref 80.0–100.0)
Platelets: 171 10*3/uL (ref 150–400)
Platelets: 161 10*3/uL (ref 150–400)
RBC: 4.02 MIL/uL (ref 3.87–5.11)
RBC: 3.73 MIL/uL — ABNORMAL LOW (ref 3.87–5.11)
RDW: 13.4 % (ref 11.5–15.5)
RDW: 13.3 % (ref 11.5–15.5)
WBC: 8.6 10*3/uL (ref 4.0–10.5)
WBC: 12.7 10*3/uL — ABNORMAL HIGH (ref 4.0–10.5)
nRBC: 0 % (ref 0.0–0.2)
nRBC: 0 % (ref 0.0–0.2)

## 2019-03-01 LAB — TYPE AND SCREEN
ABO/RH(D): O POS
Antibody Screen: NEGATIVE

## 2019-03-01 LAB — PROTEIN / CREATININE RATIO, URINE
Creatinine, Urine: 169.55 mg/dL
Protein Creatinine Ratio: 0.25 mg/mg{Cre} — ABNORMAL HIGH (ref 0.00–0.15)
Total Protein, Urine: 42 mg/dL

## 2019-03-01 MED ORDER — BENZOCAINE-MENTHOL 20-0.5 % EX AERO: 1.0000 "application " | INHALATION_SPRAY | CUTANEOUS | Status: DC | PRN

## 2019-03-01 MED ORDER — SIMETHICONE 80 MG PO CHEW: 80.0000 mg | CHEWABLE_TABLET | ORAL | Status: DC | PRN

## 2019-03-01 MED ORDER — OXYCODONE-ACETAMINOPHEN 5-325 MG PO TABS: 1.0000 | ORAL_TABLET | ORAL | Status: DC | PRN

## 2019-03-01 MED ORDER — PENICILLIN G 3 MILLION UNITS IVPB - SIMPLE MED: 3.0000 10*6.[IU] | INTRAVENOUS | Status: DC

## 2019-03-01 MED ORDER — ACETAMINOPHEN 325 MG PO TABS: 650.0000 mg | ORAL_TABLET | ORAL | Status: DC | PRN

## 2019-03-01 MED ORDER — FENTANYL CITRATE (PF) 100 MCG/2ML IJ SOLN: 50.0000 ug | INTRAMUSCULAR | Status: DC | PRN

## 2019-03-01 MED ORDER — PRENATAL MULTIVITAMIN CH
1.0000 | ORAL_TABLET | Freq: Every day | ORAL | Status: DC
  Administered 2019-03-02: 11:00:00 1 via ORAL
  Filled 2019-03-01 (×2): qty 1

## 2019-03-01 MED ORDER — LACTATED RINGERS IV SOLN: 500.0000 mL | INTRAVENOUS | Status: DC | PRN

## 2019-03-01 MED ORDER — IBUPROFEN 600 MG PO TABS
600.0000 mg | ORAL_TABLET | Freq: Four times a day (QID) | ORAL | Status: DC
  Administered 2019-03-01 – 2019-03-03 (×8): 600 mg via ORAL
  Filled 2019-03-01 (×8): qty 1

## 2019-03-01 MED ORDER — EPHEDRINE 5 MG/ML INJ: 10.0000 mg | INTRAVENOUS | Status: DC | PRN

## 2019-03-01 MED ORDER — ONDANSETRON HCL 4 MG/2ML IJ SOLN: 4.0000 mg | INTRAMUSCULAR | Status: DC | PRN

## 2019-03-01 MED ORDER — OXYTOCIN 40 UNITS IN NORMAL SALINE INFUSION - SIMPLE MED: 2.5000 [IU]/h | INTRAVENOUS | Status: DC

## 2019-03-01 MED ORDER — COCONUT OIL OIL
1.0000 "application " | TOPICAL_OIL | Status: DC | PRN
  Administered 2019-03-02: 1 via TOPICAL

## 2019-03-01 MED ORDER — OXYTOCIN BOLUS FROM INFUSION
500.0000 mL | Freq: Once | INTRAVENOUS | Status: AC
  Administered 2019-03-01: 13:00:00 500 mL via INTRAVENOUS

## 2019-03-01 MED ORDER — PHENYLEPHRINE 40 MCG/ML (10ML) SYRINGE FOR IV PUSH (FOR BLOOD PRESSURE SUPPORT)
80.0000 ug | PREFILLED_SYRINGE | INTRAVENOUS | Status: DC | PRN
  Filled 2019-03-01: qty 10

## 2019-03-01 MED ORDER — SODIUM CHLORIDE 0.9 % IV SOLN
5.0000 10*6.[IU] | Freq: Once | INTRAVENOUS | Status: AC
  Administered 2019-03-01: 08:00:00 5 10*6.[IU] via INTRAVENOUS
  Filled 2019-03-01: qty 5

## 2019-03-01 MED ORDER — PHENYLEPHRINE 40 MCG/ML (10ML) SYRINGE FOR IV PUSH (FOR BLOOD PRESSURE SUPPORT): 80.0000 ug | PREFILLED_SYRINGE | INTRAVENOUS | Status: DC | PRN

## 2019-03-01 MED ORDER — LACTATED RINGERS IV SOLN
INTRAVENOUS | Status: DC
  Administered 2019-03-01: 08:00:00 via INTRAVENOUS

## 2019-03-01 MED ORDER — TETANUS-DIPHTH-ACELL PERTUSSIS 5-2.5-18.5 LF-MCG/0.5 IM SUSP: 0.5000 mL | Freq: Once | INTRAMUSCULAR | Status: DC

## 2019-03-01 MED ORDER — LACTATED RINGERS IV SOLN: 500.0000 mL | Freq: Once | INTRAVENOUS | Status: DC

## 2019-03-01 MED ORDER — FENTANYL-BUPIVACAINE-NACL 0.5-0.125-0.9 MG/250ML-% EP SOLN
12.0000 mL/h | EPIDURAL | Status: DC | PRN
  Filled 2019-03-01: qty 250

## 2019-03-01 MED ORDER — DIBUCAINE (PERIANAL) 1 % EX OINT: 1.0000 "application " | TOPICAL_OINTMENT | CUTANEOUS | Status: DC | PRN

## 2019-03-01 MED ORDER — SENNOSIDES-DOCUSATE SODIUM 8.6-50 MG PO TABS
2.0000 | ORAL_TABLET | ORAL | Status: DC
  Administered 2019-03-01 – 2019-03-02 (×2): 2 via ORAL
  Filled 2019-03-01 (×2): qty 2

## 2019-03-01 MED ORDER — DIPHENHYDRAMINE HCL 25 MG PO CAPS: 25.0000 mg | ORAL_CAPSULE | Freq: Four times a day (QID) | ORAL | Status: DC | PRN

## 2019-03-01 MED ORDER — DIPHENHYDRAMINE HCL 50 MG/ML IJ SOLN: 12.5000 mg | INTRAMUSCULAR | Status: DC | PRN

## 2019-03-01 MED ORDER — TERBUTALINE SULFATE 1 MG/ML IJ SOLN: 0.2500 mg | Freq: Once | INTRAMUSCULAR | Status: DC | PRN

## 2019-03-01 MED ORDER — ZOLPIDEM TARTRATE 5 MG PO TABS: 5.0000 mg | ORAL_TABLET | Freq: Every evening | ORAL | Status: DC | PRN

## 2019-03-01 MED ORDER — SOD CITRATE-CITRIC ACID 500-334 MG/5ML PO SOLN: 30.0000 mL | ORAL | Status: DC | PRN

## 2019-03-01 MED ORDER — LIDOCAINE HCL (PF) 1 % IJ SOLN
INTRAMUSCULAR | Status: DC | PRN
  Administered 2019-03-01 (×2): 5 mL via EPIDURAL

## 2019-03-01 MED ORDER — ONDANSETRON HCL 4 MG PO TABS: 4.0000 mg | ORAL_TABLET | ORAL | Status: DC | PRN

## 2019-03-01 MED ORDER — LIDOCAINE HCL (PF) 1 % IJ SOLN: 30.0000 mL | INTRAMUSCULAR | Status: DC | PRN

## 2019-03-01 MED ORDER — ONDANSETRON HCL 4 MG/2ML IJ SOLN
4.0000 mg | Freq: Four times a day (QID) | INTRAMUSCULAR | Status: DC | PRN
  Administered 2019-03-01: 4 mg via INTRAVENOUS
  Filled 2019-03-01: qty 2

## 2019-03-01 MED ORDER — OXYTOCIN 40 UNITS IN NORMAL SALINE INFUSION - SIMPLE MED
1.0000 m[IU]/min | INTRAVENOUS | Status: DC
  Administered 2019-03-01: 08:00:00 2 m[IU]/min via INTRAVENOUS
  Filled 2019-03-01: qty 1000

## 2019-03-01 MED ORDER — WITCH HAZEL-GLYCERIN EX PADS: 1.0000 "application " | MEDICATED_PAD | CUTANEOUS | Status: DC | PRN

## 2019-03-01 MED ORDER — OXYCODONE-ACETAMINOPHEN 5-325 MG PO TABS: 2.0000 | ORAL_TABLET | ORAL | Status: DC | PRN

## 2019-03-01 MED ORDER — SODIUM CHLORIDE (PF) 0.9 % IJ SOLN
INTRAMUSCULAR | Status: DC | PRN
  Administered 2019-03-01: 12 mL/h via EPIDURAL

## 2019-03-01 NOTE — Progress Notes (Signed)
Patient ID: Stephanie Prince, female   DOB: July 10, 1981, 38 y.o.   MRN: [id number] Pt feeling moderate contractions, started on pitocin and had SROM at 948am Getting ready to receive epidural  afeb  BP 130-140/90-100 FHR category 1  Labs WNL   Will follow progress, pitocin at 6 mu

## 2019-03-01 NOTE — Lactation Note (Signed)
This note was copied from a baby's chart. Lactation Consultation Note Baby 7 hrs old. Mom states baby has been wanting to BF a lot. Mom stated when baby latching, she felt it was a deep latch but hurt extremely bad. Baby often bites her, when unlatched nipples pinched. Mom stated she had some blisters to her Rt. Nipple. Mom stated she had to wear a NS w/one of her other children.  Mom asked for NS. Stated it felt so much better. No pain. # 24 NS comfortable. Mom has red everted compressible nipples. Mom feeding in cradle position.  Baby gulping at the breast. Mom states seeing visible colostrum in NS when she switched breast.  Mom has 26 yr old that she BF for 1 yr and a 36 yr old that she BF for 3 months d/t returning to work her milk supply decreased. 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 LC gave mom her Employee pump. copies made of Insurance card.  Mom states BF going much better w/NS.  LC reviewed newborn behavior, STS, I&O, breast massage. Encouraged mom to call for assistance or questions. Lactation brochure at bedside.  Patient Name: Stephanie Prince [name] Date: 03/01/2019 Reason for consult: Initial assessment   Maternal Data Has patient been taught Hand Expression?: Yes Does the patient have breastfeeding experience prior to this delivery?: Yes  Feeding Feeding Type: Breast Fed  LATCH Score Latch: Grasps breast easily, tongue down, lips flanged, rhythmical sucking.  Audible Swallowing: Spontaneous and intermittent  Type of Nipple: Everted at rest and after stimulation  Comfort (Breast/Nipple): Filling, red/small blisters or bruises, mild/mod discomfort  Hold (Positioning): No assistance needed to correctly position infant at breast.  LATCH Score: 9  Interventions Interventions: Breast feeding basics reviewed;Skin to skin;Position options;Breast massage;Breast compression;Comfort gels  Lactation Tools Discussed/Used Tools: Nipple Shields;Comfort gels Nipple shield size:  24 WIC Program: No   Consult Status Consult Status: Follow-up Date: 03/02/19 Follow-up type: In-patient    Theodoro Kalata 03/01/2019, 11:00 PM

## 2019-03-01 NOTE — Anesthesia Postprocedure Evaluation (Signed)
Anesthesia Post Note  Patient: Stephanie Prince  Procedure(s) Performed: AN AD [place]     Patient location during evaluation: Mother Baby Anesthesia Type: Epidural Level of consciousness: awake and alert Pain management: pain level controlled Vital Signs Assessment: post-procedure vital signs reviewed and stable Respiratory status: spontaneous breathing, nonlabored ventilation and respiratory function stable Cardiovascular status: stable Postop Assessment: no headache, no backache and epidural receding Anesthetic complications: no    Last Vitals:  Vitals:   03/01/19 1428 03/01/19 1540  BP: 123/76 120/76  Pulse: 66 66  Resp: 16 18  Temp: 36.6 C (!) 36.3 C  SpO2:  97%    Last Pain:  Vitals:   03/01/19 1740  TempSrc:   PainSc: 1    Pain Goal:                   [employer]

## 2019-03-01 NOTE — Anesthesia Preprocedure Evaluation (Signed)
Anesthesia Evaluation  Patient identified by MRN, date of birth, ID band Patient awake    Reviewed: Allergy & Precautions, H&P , NPO status , Patient's Chart, lab work & pertinent test results  Airway Mallampati: II   Neck ROM: full    Dental   Pulmonary neg pulmonary ROS,    breath sounds clear to auscultation       Cardiovascular hypertension,  Rhythm:regular Rate:Normal     Neuro/Psych    GI/Hepatic   Endo/Other    Renal/GU      Musculoskeletal   Abdominal   Peds  Hematology  (+) Blood dyscrasia, anemia ,   Anesthesia Other Findings   Reproductive/Obstetrics (+) Pregnancy                             Anesthesia Physical Anesthesia Plan  ASA: II  Anesthesia Plan: Epidural   Post-op Pain Management:    Induction: Intravenous  PONV Risk Score and Plan: 2 and Treatment may vary due to age or medical condition  Airway Management Planned: Natural Airway  Additional Equipment:   Intra-op Plan:   Post-operative Plan:   Informed Consent: I have reviewed the patients History and Physical, chart, labs and discussed the procedure including the risks, benefits and alternatives for the proposed anesthesia with the patient or authorized representative who has indicated his/her understanding and acceptance.       Plan Discussed with: Anesthesiologist  Anesthesia Plan Comments:         Anesthesia Quick Evaluation

## 2019-03-01 NOTE — Anesthesia Procedure Notes (Signed)
Epidural Patient location during procedure: OB Start time: 03/01/2019 9:58 AM End time: 03/01/2019 10:05 AM  Staffing Anesthesiologist: Albertha Ghee, MD Performed: anesthesiologist   Preanesthetic Checklist Completed: patient identified, site marked, pre-op evaluation, timeout performed, IV checked, risks and benefits discussed and monitors and equipment checked  Epidural Patient position: sitting Prep: DuraPrep Patient monitoring: heart rate, cardiac monitor, continuous pulse ox and blood pressure Approach: midline Location: L2-L3 Injection technique: LOR saline  Needle:  Needle type: Tuohy  Needle gauge: 17 G Needle length: 9 cm Needle insertion depth: 5 cm Catheter type: closed end flexible Catheter size: 19 Gauge Catheter at skin depth: 12 cm Test dose: negative and Other  Assessment Events: blood not aspirated, injection not painful, no injection resistance and negative IV test  Additional Notes Informed consent obtained prior to proceeding including risk of failure, 1% risk of PDPH, risk of minor discomfort and bruising.  Discussed rare but serious complications including epidural abscess, permanent nerve injury, epidural hematoma.  Discussed alternatives to epidural analgesia and patient desires to proceed.  Timeout performed pre-procedure verifying patient name, procedure, and platelet count.  Patient tolerated procedure well. Reason for block:procedure for pain

## 2019-03-02 LAB — CBC
HCT: 30.7 % — ABNORMAL LOW (ref 36.0–46.0)
Hemoglobin: 10.1 g/dL — ABNORMAL LOW (ref 12.0–15.0)
MCH: 28.8 pg (ref 26.0–34.0)
MCHC: 32.9 g/dL (ref 30.0–36.0)
MCV: 87.5 fL (ref 80.0–100.0)
Platelets: 156 10*3/uL (ref 150–400)
RBC: 3.51 MIL/uL — ABNORMAL LOW (ref 3.87–5.11)
RDW: 13.4 % (ref 11.5–15.5)
WBC: 8.2 10*3/uL (ref 4.0–10.5)
nRBC: 0 % (ref 0.0–0.2)

## 2019-03-02 LAB — ABO/RH: ABO/RH(D): O POS

## 2019-03-02 MED ORDER — IBUPROFEN 600 MG PO TABS: 600.0000 mg | ORAL_TABLET | Freq: Four times a day (QID) | ORAL | 1 refills | Status: DC | PRN

## 2019-03-02 MED ORDER — COMPLETENATE 29-1 MG PO CHEW: 2.0000 | CHEWABLE_TABLET | Freq: Every day | ORAL | 5 refills | Status: DC

## 2019-03-02 MED FILL — IBUPROFEN 600 MG TABLET: 600 | 12 days supply | Qty: 45 | Fill #0

## 2019-03-02 NOTE — Progress Notes (Signed)
Post Partum Day 1 Subjective: no complaints, up ad lib, voiding, tolerating PO and nl lochia, pain controlled  Objective: Blood pressure 118/68, pulse 72, temperature 97.9 F (36.6 C), temperature source Oral, resp. rate 13, height 5\' 4"  (1.626 m), weight 68.5 kg, last menstrual period 06/08/2018, SpO2 98 %, unknown if currently breastfeeding.  Physical Exam:  General: alert and no distress Lochia: appropriate Uterine Fundus: firm  Recent Labs    03/01/19 1453 03/02/19 0518  HGB 10.6* 10.1*  HCT 32.2* 30.7*    Assessment/Plan: Discharge home if baby ready, Breastfeeding and Lactation consult.  Routine PP care.  Will d/c with Motrin and PNV.  F/u 1 and 6 weeks   LOS: 1 day   [place] 03/02/2019, 8:25 AM

## 2019-03-02 NOTE — Discharge Summary (Addendum)
OB Discharge Summary     Patient Name: Stephanie Prince DOB: 14-Aug-1981 MRN: [id number]  Date of admission: 03/01/2019 Delivering MD: Paula Compton   Date of discharge: 03/03/2019  Admitting diagnosis: pregnancy Intrauterine pregnancy: [redacted]w[redacted]d     Secondary diagnosis:  Active Problems:   NSVD (normal spontaneous vaginal delivery)   Gestational hypertension without significant proteinuria during pregnancy in third trimester, antepartum  Additional problems: N/A     Discharge diagnosis: Term Pregnancy Delivered                                                                                                Post partum procedures:N/A  Augmentation: Pitocin  Complications: None  Hospital course:  Induction of Labor With Vaginal Delivery   38 y.o. yo [phone] at [redacted]w[redacted]d was admitted to the hospital 03/01/2019 for induction of labor.  Indication for induction: Gestational hypertension.  Patient had an uncomplicated labor course as follows: Membrane Rupture Time/Date: 9:38 AM ,03/01/2019   Intrapartum Procedures: Episiotomy: None [1]                                         Lacerations:  None [1]  Patient had delivery of a Viable infant.  Information for the patient's newborn:  Sharalyn, [place] [[id number]]  Delivery Method: Vaginal, Spontaneous(Filed from Delivery Summary)   03/01/2019  Details of delivery can be found in separate delivery note.  Patient had a routine postpartum course. BP slightly elevated PPD #2 but not consistently high enough to warrant medication.  Patient is discharged home 03/03/2019.  Physical exam  Vitals:   03/01/19 1540 03/01/19 2000 03/01/19 2357 03/02/19 0415  BP: 120/76 110/66 117/77 118/68  Pulse: 66 62 72   Resp: 18 15 14 13   Temp: (!) 97.4 F (36.3 C) 98 F (36.7 C) 97.7 F (36.5 C) 97.9 F (36.6 C)  TempSrc:  Oral Oral Oral  SpO2: 97% 98% 98% 98%  Weight:      Height:       General: alert and no distress Lochia: appropriate Uterine Fundus:  firm  Labs: Lab Results  Component Value Date   WBC 8.2 03/02/2019   HGB 10.1 (L) 03/02/2019   HCT 30.7 (L) 03/02/2019   MCV 87.5 03/02/2019   PLT 156 03/02/2019   CMP Latest Ref Rng & Units 03/01/2019  Glucose 70 - 99 mg/dL 82  BUN 6 - 20 mg/dL 6  Creatinine 0.44 - 1.00 mg/dL 0.69  Sodium 135 - 145 mmol/L 134(L)  Potassium 3.5 - 5.1 mmol/L 4.1  Chloride 98 - 111 mmol/L 101  CO2 22 - 32 mmol/L 21(L)  Calcium 8.9 - 10.3 mg/dL 8.4(L)  Total Protein 6.5 - 8.1 g/dL 6.4(L)  Total Bilirubin 0.3 - 1.2 mg/dL 0.6  Alkaline Phos 38 - 126 U/L 160(H)  AST 15 - 41 U/L 23  ALT 0 - 44 U/L 14    Discharge instruction: per After Visit Summary and "Baby and Me Booklet".  After visit meds:  Allergies as  of 03/02/2019   No Known Allergies     Medication List    STOP taking these medications   aspirin EC 81 MG tablet     TAKE these medications   calcium carbonate 500 MG chewable tablet Commonly known as:  TUMS - dosed in mg elemental calcium Chew 2 tablets by mouth as needed for indigestion or heartburn.   famotidine 20 MG tablet Commonly known as:  PEPCID Take 20 mg by mouth 2 (two) times daily.   ibuprofen 600 MG tablet Commonly known as:  ADVIL Take 1 tablet (600 mg total) by mouth every 6 (six) hours as needed.   prenatal vitamin w/FE, FA 29-1 MG Chew chewable tablet Chew 2 tablets by mouth daily.       Diet: routine diet  Activity: Advance as tolerated. Pelvic rest for 6 weeks.   Outpatient follow up:1 and 6 weeks Newborn Data: Live born female  Birth Weight: 7 lb 9.2 oz (3436 g) APGAR: 8, 9  Newborn Delivery   Birth date/time:  03/01/2019 13:05:00 Delivery type:  Vaginal, Spontaneous     Baby Feeding: Breast Disposition:home with mother   03/02/2019 Janyth Contes, MD

## 2019-03-03 NOTE — Progress Notes (Signed)
PPD #2 No problems, stayed yesterday for baby issues, ready to go today Afeb, VSS, BP 140/85-94 Fundus firm Will see what BP is at 1030, decide if needs medication to go home

## 2019-03-03 NOTE — Lactation Note (Signed)
This note was copied from a baby's chart. Lactation Consultation Note  Patient Name: Stephanie Prince [id number] Date: 03/03/2019   Infant is 44 hours old. Mom's breasts are filling nicely. Mom was noted to have a very mild compression stripe on her R nipple and a small abraded spot on the tip of her L nipple (not worse since seen by the 1st LC). Mom is using a NS size 24, but she comments that infant may not be getting the entire nipple shield in her mouth. I cautioned Mom against using a nipple shield that infant's mouth may not be able to accommodate (in regards to effect on milk supply & infant's intake).   Mom was willing to show me how infant latches. Mom did not use the nipple shield & infant latched w/relative ease. Mom did have some pinching which seemed to get better with the lowering of infant's mandible & progression of time. Suck:swallow ratio noted to be 1:1 with feeding. Mom admits that it can be difficult to tease out if discomfort felt is from residual soreness from previous latches or a current painful latch.When infant released latch, Mom's nipple was rounded & there was no evidence of new trauma. It is possible that Mom had previously been using a "bulls-eye approach" to latching infant, which was causing the discomfort. Specifics of an asymmetric latch were shown via [employer] animation to demonstrate the ideal way to latch a newborn.   Mom seemed pleased with results. She voiced that she might call me to return for a subsequent feeding.   Matthias Hughs [facility] 03/03/2019, 8:22 AM

## 2019-03-04 ENCOUNTER — Other Ambulatory Visit (HOSPITAL_COMMUNITY)
Admission: RE | Admit: 2019-03-04 | Discharge: 2019-03-04 | Disposition: A | Discharge status: Home / Self Care | Payer: No Typology Code available for payment source | Source: Ambulatory Visit | Attending: Obstetrics and Gynecology | Admitting: Obstetrics and Gynecology

## 2019-03-04 LAB — RPR: RPR Ser Ql: NONREACTIVE

## 2019-03-08 ENCOUNTER — Inpatient Hospital Stay (HOSPITAL_COMMUNITY): Payer: No Typology Code available for payment source

## 2019-04-26 ENCOUNTER — Encounter: Payer: Self-pay | Admitting: Family Medicine

## 2019-04-27 ENCOUNTER — Ambulatory Visit (INDEPENDENT_AMBULATORY_CARE_PROVIDER_SITE_OTHER): Payer: No Typology Code available for payment source | Admitting: Family Medicine

## 2019-04-27 ENCOUNTER — Other Ambulatory Visit: Payer: Self-pay

## 2019-04-27 ENCOUNTER — Encounter: Payer: Self-pay | Admitting: Family Medicine

## 2019-04-27 VITALS — BP 118/78 | HR 68 | Temp 98.2°F | Ht 64.0 in | Wt 138.5 lb

## 2019-04-27 DIAGNOSIS — R1013 Epigastric pain: Secondary | ICD-10-CM | POA: Diagnosis not present

## 2019-04-27 LAB — COMPREHENSIVE METABOLIC PANEL
ALT: 28 U/L (ref 0–35)
AST: 18 U/L (ref 0–37)
Albumin: 4.2 g/dL (ref 3.5–5.2)
Alkaline Phosphatase: 65 U/L (ref 39–117)
BUN: 16 mg/dL (ref 6–23)
CO2: 30 mEq/L (ref 19–32)
Calcium: 8.8 mg/dL (ref 8.4–10.5)
Chloride: 102 mEq/L (ref 96–112)
Creatinine, Ser: 0.84 mg/dL (ref 0.40–1.20)
GFR: 76.93 mL/min (ref >60.00)
Glucose, Bld: 82 mg/dL (ref 70–99)
Potassium: 4.5 mEq/L (ref 3.5–5.1)
Sodium: 139 mEq/L (ref 135–145)
Total Bilirubin: 0.6 mg/dL (ref 0.2–1.2)
Total Protein: 6.9 g/dL (ref 6.0–8.3)

## 2019-04-27 LAB — CBC
HCT: 38.5 % (ref 36.0–46.0)
Hemoglobin: 12.4 g/dL (ref 12.0–15.0)
MCHC: 32.2 g/dL (ref 30.0–36.0)
MCV: 89 fl (ref 78.0–100.0)
Platelets: 236 10*3/uL (ref 150.0–400.0)
RBC: 4.32 Mil/uL (ref 3.87–5.11)
RDW: 15.4 % (ref 11.5–15.5)
WBC: 5.4 10*3/uL (ref 4.0–10.5)

## 2019-04-27 MED ORDER — PANTOPRAZOLE SODIUM 40 MG PO TBEC: 40.0000 mg | DELAYED_RELEASE_TABLET | Freq: Every day | ORAL | 3 refills | Status: DC

## 2019-04-27 MED ORDER — FLUTICASONE PROPIONATE 50 MCG/ACT NA SUSP: 2.0000 | Freq: Every day | NASAL | 2 refills | Status: DC

## 2019-04-27 MED FILL — FLUTICASONE PROP 50 MCG SPR: 50 | 30 days supply | Qty: 16 | Fill #0

## 2019-04-27 MED FILL — PANTOPRAZOLE SOD DR 40 MG T: 40 | 90 days supply | Qty: 90 | Fill #0

## 2019-04-27 NOTE — Patient Instructions (Addendum)
Give [place] 2-3 business days to get the results of your labs back.   We will be in touch regarding your labs.  Someone should reach out regarding your ultrasound in the next couple days.   Flonase (fluticasone); nasal spray that is over the counter. 2 sprays each nostril, once daily. Aim towards the same side eye when you spray.  Let us know if you need anything.

## 2019-04-27 NOTE — Progress Notes (Signed)
Chief Complaint  Patient presents with  . Abdominal Pain    Subjective: Patient is a 38 y.o. female here for abd pain.  Happened throughout preg, continued after delivering on 5/12. Feels like a stone sitting in her stomach. No consistent a/w meals. Radiates to middle of her back. Sometimes feels burning, not classic like her reflux during preg. Some nausea. No vomiting or BM changes. Tums and Pepto-Bismol unhelpful. +Famhx of gallstones and inflammation.   +headaches over sinuses since headaches as well. Tylenol rarely helpful. No hx of allergies.   ROS: Const: No fevers GI: As noted in HPI  Past Medical History:  Diagnosis Date  . Anemia   . History of gestational hypertension   . Hx of varicella     Objective: BP 118/78 (BP Location: Left Arm, Patient Position: Sitting, Cuff Size: Normal)   Pulse 68   Temp 98.2 F (36.8 C) (Oral)   Ht 5\' 4"  (1.626 m)   Wt 138 lb 8 oz (62.8 kg)   SpO2 99%   BMI 23.77 kg/m  General: Awake, appears stated age HEENT: MMM, EOMi Heart: RRR, no murmurs Lungs: CTAB, no rales, wheezes or rhonchi. No accessory muscle use Abd: BS+, S, NT, ND, neg Murphy's, Rovsing's, McBurneys.  Psych: Age appropriate judgment and insight, normal affect and mood  Assessment and Plan: Epigastric abdominal pain - Plan: US Abdomen Limited RUQ, CBC, Comprehensive metabolic panel; I don't think she has acute cholecystitis. Given her hx, will order [place] to ck for stones. Will also tx w Protonix for likely reflux.   Sinus pressure- Flonase daily. Tylenol prn. Would consider migraine type headaches if not for Xray. Will consider propranolol if no improvement.  F/u pending results.  The patient voiced understanding and agreement to the plan.  [place], DO 04/27/19  10:00 AM

## 2019-05-02 ENCOUNTER — Other Ambulatory Visit: Payer: Self-pay

## 2019-05-02 ENCOUNTER — Ambulatory Visit (HOSPITAL_BASED_OUTPATIENT_CLINIC_OR_DEPARTMENT_OTHER)
Admission: RE | Admit: 2019-05-02 | Discharge: 2019-05-02 | Disposition: A | Discharge status: Home / Self Care | Payer: No Typology Code available for payment source | Source: Ambulatory Visit | Attending: Family Medicine | Admitting: Family Medicine

## 2019-05-02 DIAGNOSIS — R1013 Epigastric pain: Secondary | ICD-10-CM | POA: Insufficient documentation

## 2019-07-11 ENCOUNTER — Other Ambulatory Visit: Payer: Self-pay | Admitting: *Deleted

## 2019-07-11 DIAGNOSIS — Z20822 Contact with and (suspected) exposure to covid-19: Secondary | ICD-10-CM

## 2019-07-12 LAB — NOVEL CORONAVIRUS, NAA: SARS-CoV-2, NAA: NOT DETECTED

## 2019-07-26 ENCOUNTER — Encounter: Payer: Self-pay | Admitting: Family Medicine

## 2019-07-26 ENCOUNTER — Other Ambulatory Visit: Payer: Self-pay | Admitting: Family Medicine

## 2019-07-26 MED ORDER — BIMATOPROST 0.03 % EX SOLN: CUTANEOUS | 1 refills | Status: DC

## 2019-09-02 ENCOUNTER — Emergency Department
Admission: EM | Admit: 2019-09-02 | Discharge: 2019-09-02 | Disposition: A | Discharge status: Home / Self Care | Payer: No Typology Code available for payment source | Source: Home / Self Care

## 2019-09-02 ENCOUNTER — Other Ambulatory Visit: Payer: Self-pay

## 2019-09-02 ENCOUNTER — Telehealth: Payer: No Typology Code available for payment source

## 2019-09-02 DIAGNOSIS — J189 Pneumonia, unspecified organism: Secondary | ICD-10-CM

## 2019-09-02 DIAGNOSIS — R05 Cough: Secondary | ICD-10-CM

## 2019-09-02 DIAGNOSIS — R053 Chronic cough: Secondary | ICD-10-CM

## 2019-09-02 MED ORDER — AZITHROMYCIN 250 MG PO TABS: 250.0000 mg | ORAL_TABLET | Freq: Every day | ORAL | 0 refills | Status: DC

## 2019-09-02 MED ORDER — BENZONATATE 100 MG PO CAPS: 100.0000 mg | ORAL_CAPSULE | Freq: Three times a day (TID) | ORAL | 0 refills | Status: DC

## 2019-09-02 MED FILL — BENZONATATE 100 MG CAPS: 100 | 4 days supply | Qty: 21 | Fill #0

## 2019-09-02 MED FILL — AZITHROMYCIN 250 MG TABLET: 250 | 5 days supply | Qty: 6 | Fill #0

## 2019-09-02 NOTE — Discharge Instructions (Signed)

## 2019-09-02 NOTE — ED Provider Notes (Signed)
[facility]    CSN: [id number] Arrival date & time: 09/02/19  [id number]      History   Chief Complaint Chief Complaint  Patient presents with  . Cough    HPI Stephanie Prince is a 38 y.o. female.   HPI Stephanie Prince is a 38 y.o. female presenting to UC with c/o mildly productive cough for 3 weeks.  She notes her entire family has had nasal congestion and cough. Her MIL was dx recently with pneumonia.  Pt's toddler had a cold last week but is better now. Her husband still has a cough. Pt concerned she may have "walking pneumonia."  She tested negative for Covid-19 on 08/22/2019. No known exposure to Covid.  Denies chest pain, or SOB. No fever, chills, n/v/d. No hx of asthma. She did take mucinex but states it felt like it dried her out too much, did not help much with her cough.    Past Medical History:  Diagnosis Date  . Anemia   . History of gestational hypertension   . Hx of varicella     Patient Active Problem List   Diagnosis Date Noted  . Gestational hypertension without significant proteinuria during pregnancy in third trimester, antepartum 03/01/2019  . IUGR (intrauterine growth restriction) affecting care of mother, third trimester, fetus 1 11/14/2016  . NSVD (normal spontaneous vaginal delivery) 11/14/2016  . Severe preeclampsia 08/14/2015    Past Surgical History:  Procedure Laterality Date  . APPENDECTOMY    . WISDOM TOOTH EXTRACTION      OB History    Gravida  4   Para  3   Term  2   Preterm  1   AB  1   Living  3     SAB  1   TAB      Ectopic      Multiple  0   Live Births  3            Home Medications    Prior to Admission medications   Medication Sig Start Date End Date Taking? Authorizing Provider  azithromycin (ZITHROMAX) 250 MG tablet Take 1 tablet (250 mg total) by mouth daily. Take first 2 tablets together, then 1 every day until finished. 09/02/19   Noe Gens, PA-C  benzonatate (TESSALON) 100 MG capsule Take  1-2 capsules (100-200 mg total) by mouth every 8 (eight) hours. 09/02/19   Noe Gens, PA-C  bimatoprost (LATISSE) 0.03 % ophthalmic solution Place one drop on applicator and apply evenly along the skin of the upper eyelid at base of eyelashes once daily at bedtime; repeat procedure for second eye (use a clean applicator). 07/26/19   Shelda Pal, DO  fluticasone (FLONASE) 50 MCG/ACT nasal spray Place 2 sprays into both nostrils daily. 04/27/19   Shelda Pal, DO  ibuprofen (ADVIL) 600 MG tablet Take 1 tablet (600 mg total) by mouth every 6 (six) hours as needed. 03/02/19   Bovard-Stuckert, Jeral Fruit, MD  pantoprazole (PROTONIX) 40 MG tablet Take 1 tablet (40 mg total) by mouth daily. 04/27/19   Shelda Pal, DO  prenatal vitamin w/FE, FA (NATACHEW) 29-1 MG CHEW chewable tablet Chew 2 tablets by mouth daily. 03/02/19   Bovard-StuckertJeral Fruit, MD    Family History Family History  Problem Relation Age of Onset  . Hypertension Mother   . Other Father        brain tumor  . Hypertension Maternal Grandmother   . Heart failure Maternal Grandmother   .  Alcohol abuse Maternal Grandfather   . Cancer Paternal Grandfather   . Breast cancer Maternal Aunt   . Cancer Maternal Uncle   . Cancer Maternal Aunt     Social History Social History   Tobacco Use  . Smoking status: Never Smoker  . Smokeless tobacco: Never Used  Substance Use Topics  . Alcohol use: No  . Drug use: No     Allergies   Patient has no known allergies.   Review of Systems Review of Systems  Constitutional: Negative for chills and fever.  HENT: Positive for congestion. Negative for ear pain, sore throat, trouble swallowing and voice change.   Respiratory: Positive for cough. Negative for shortness of breath.   Cardiovascular: Negative for chest pain and palpitations.  Gastrointestinal: Negative for abdominal pain, diarrhea, nausea and vomiting.  Musculoskeletal: Negative for arthralgias, back  pain and myalgias.  Skin: Negative for rash.  Neurological: Negative for dizziness, light-headedness and headaches.     Physical Exam Triage Vital Signs ED Triage Vitals  Enc Vitals Group     BP 09/02/19 0953 122/84     Pulse Rate 09/02/19 0953 82     Resp 09/02/19 0953 18     Temp 09/02/19 0953 98.1 F (36.7 C)     Temp Source 09/02/19 0953 Oral     SpO2 09/02/19 0953 100 %     Weight 09/02/19 0956 137 lb (62.1 kg)     Height 09/02/19 0956 5\' 4"  (1.626 m)     Head Circumference --      Peak Flow --      Pain Score 09/02/19 0956 0     Pain Loc --      Pain Edu? --      Excl. in [place]? --    No data found.  Updated Vital Signs BP 122/84 (BP Location: Right Arm)   Pulse 82   Temp 98.1 F (36.7 C) (Oral)   Resp 18   Ht 5\' 4"  (1.626 m)   Wt 137 lb (62.1 kg)   LMP  (LMP Unknown)   SpO2 100%   BMI 23.52 kg/m   Visual Acuity Right Eye Distance:   Left Eye Distance:   Bilateral Distance:    Right Eye Near:   Left Eye Near:    Bilateral Near:     Physical Exam Vitals signs and nursing note reviewed.  Constitutional:      General: She is not in acute distress.    Appearance: Normal appearance. She is well-developed. She is not ill-appearing, toxic-appearing or diaphoretic.  HENT:     Head: Normocephalic and atraumatic.     Right Ear: Tympanic membrane and ear canal normal.     Left Ear: Tympanic membrane and ear canal normal.     Nose: Nose normal.     Mouth/Throat:     Mouth: Mucous membranes are moist.     Pharynx: Oropharynx is clear.  Eyes:     Extraocular Movements: Extraocular movements intact.  Neck:     Musculoskeletal: Normal range of motion.  Cardiovascular:     Rate and Rhythm: Normal rate and regular rhythm.  Pulmonary:     Effort: Pulmonary effort is normal.     Breath sounds: Examination of the left-upper field reveals wheezing. Examination of the left-lower field reveals wheezing, rhonchi and rales. Wheezing, rhonchi and rales present. No  decreased breath sounds.  Musculoskeletal: Normal range of motion.  Skin:    General: Skin is warm and dry.  Neurological:     Mental Status: She is alert and oriented to person, place, and time.  Psychiatric:        Behavior: Behavior normal.      UC Treatments / Results  Labs (all labs ordered are listed, but only abnormal results are displayed) Labs Reviewed - No data to display  EKG   Radiology No results found.  Procedures Procedures (including critical care time)  Medications Ordered in UC Medications - No data to display  Initial Impression / Assessment and Plan / UC Course  I have reviewed the triage vital signs and the nursing notes.  Pertinent labs & imaging results that were available during my care of the patient were reviewed by me and considered in my medical decision making (see chart for details).     Hx along with lung exam concerning for Left lower lobe pneumonia Discussed confirmation with CXR vs empiric treatment, pt agreeable to empiric treatment with azithromycin  Encouraged f/u with PCP next week if not improving after antibiotics. AVS provided.  Final Clinical Impressions(s) / UC Diagnoses   Final diagnoses:  Community acquired pneumonia of left lower lobe of lung  Persistent cough for 3 weeks or longer     Discharge Instructions      Please take antibiotics as prescribed and be sure to complete entire course even if you start to feel better to ensure infection does not come back.  You may take 500mg  acetaminophen every 4-6 hours or in combination with ibuprofen 400-600mg  every 6-8 hours as needed for pain, inflammation, and fever.  Be sure to well hydrated with clear liquids and get at least 8 hours of sleep at night, preferably more while sick.   Please follow up with family medicine in 1 week if needed.     ED Prescriptions    Medication Sig Dispense Auth. Provider   azithromycin (ZITHROMAX) 250 MG tablet Take 1 tablet (250  mg total) by mouth daily. Take first 2 tablets together, then 1 every day until finished. 6 tablet Gerarda Fraction, Sanyla Summey O, PA-C   benzonatate (TESSALON) 100 MG capsule Take 1-2 capsules (100-200 mg total) by mouth every 8 (eight) hours. 21 capsule Noe Gens, [place]     PDMP not reviewed this encounter.   Noe Gens, [place] 09/02/19 1047

## 2019-09-02 NOTE — ED Triage Notes (Signed)
Pt c/o semi productive cough x 3 weeks. Says entire family has had a cough. MIL recently dx with pneumonia.  No fever. COVID neg on  11/2

## 2019-09-07 ENCOUNTER — Encounter: Payer: Self-pay | Admitting: Family Medicine

## 2019-09-07 ENCOUNTER — Other Ambulatory Visit: Payer: Self-pay

## 2019-09-07 DIAGNOSIS — Z20822 Contact with and (suspected) exposure to covid-19: Secondary | ICD-10-CM

## 2019-09-09 LAB — NOVEL CORONAVIRUS, NAA: SARS-CoV-2, NAA: NOT DETECTED

## 2019-11-10 ENCOUNTER — Encounter: Payer: Self-pay | Admitting: Family Medicine

## 2019-11-28 ENCOUNTER — Encounter: Payer: Self-pay | Admitting: Family Medicine

## 2019-11-29 ENCOUNTER — Other Ambulatory Visit: Payer: Self-pay | Admitting: Family Medicine

## 2019-11-29 DIAGNOSIS — L709 Acne, unspecified: Secondary | ICD-10-CM

## 2020-02-02 MED FILL — RETIN-A 0.025 % CREA: 0.025 | 30 days supply | Qty: 45 | Fill #0

## 2020-02-14 MED FILL — RETIN-A 0.025 % CREA: 0.025 | 30 days supply | Qty: 45 | Fill #0

## 2020-04-10 ENCOUNTER — Other Ambulatory Visit: Payer: Self-pay

## 2020-04-10 ENCOUNTER — Encounter: Payer: Self-pay | Admitting: Family Medicine

## 2020-04-10 ENCOUNTER — Ambulatory Visit (INDEPENDENT_AMBULATORY_CARE_PROVIDER_SITE_OTHER): Payer: No Typology Code available for payment source | Admitting: Family Medicine

## 2020-04-10 ENCOUNTER — Other Ambulatory Visit: Payer: Self-pay | Admitting: Family Medicine

## 2020-04-10 VITALS — BP 118/70 | HR 71 | Temp 98.1°F | Resp 12 | Ht 64.0 in | Wt 142.2 lb

## 2020-04-10 DIAGNOSIS — K769 Liver disease, unspecified: Secondary | ICD-10-CM

## 2020-04-10 DIAGNOSIS — Z0000 Encounter for general adult medical examination without abnormal findings: Secondary | ICD-10-CM | POA: Diagnosis not present

## 2020-04-10 DIAGNOSIS — Z1159 Encounter for screening for other viral diseases: Secondary | ICD-10-CM

## 2020-04-10 LAB — COMPREHENSIVE METABOLIC PANEL
ALT: 10 U/L (ref 0–35)
AST: 12 U/L (ref 0–37)
Albumin: 4.3 g/dL (ref 3.5–5.2)
Alkaline Phosphatase: 53 U/L (ref 39–117)
BUN: 11 mg/dL (ref 6–23)
CO2: 31 mEq/L (ref 19–32)
Calcium: 9.3 mg/dL (ref 8.4–10.5)
Chloride: 102 mEq/L (ref 96–112)
Creatinine, Ser: 0.72 mg/dL (ref 0.40–1.20)
GFR: 91.41 mL/min (ref >60.00)
Glucose, Bld: 88 mg/dL (ref 70–99)
Potassium: 4.5 mEq/L (ref 3.5–5.1)
Sodium: 139 mEq/L (ref 135–145)
Total Bilirubin: 0.5 mg/dL (ref 0.2–1.2)
Total Protein: 7.1 g/dL (ref 6.0–8.3)

## 2020-04-10 LAB — CBC
HCT: 39.7 % (ref 36.0–46.0)
Hemoglobin: 13.3 g/dL (ref 12.0–15.0)
MCHC: 33.4 g/dL (ref 30.0–36.0)
MCV: 89.8 fl (ref 78.0–100.0)
Platelets: 233 10*3/uL (ref 150.0–400.0)
RBC: 4.42 Mil/uL (ref 3.87–5.11)
RDW: 13.3 % (ref 11.5–15.5)
WBC: 5.9 10*3/uL (ref 4.0–10.5)

## 2020-04-10 LAB — LIPID PANEL
Cholesterol: 164 mg/dL (ref 0–200)
HDL: 42.3 mg/dL (ref >39.00)
LDL Cholesterol: 107 mg/dL — ABNORMAL HIGH (ref 0–99)
NonHDL: 121.25
Total CHOL/HDL Ratio: 4
Triglycerides: 70 mg/dL (ref 0.0–149.0)
VLDL: 14 mg/dL (ref 0.0–40.0)

## 2020-04-10 LAB — TSH: TSH: 1.34 u[IU]/mL (ref 0.35–4.50)

## 2020-04-10 LAB — VITAMIN D 25 HYDROXY (VIT D DEFICIENCY, FRACTURES): VITD: 30.08 ng/mL (ref 30.00–100.00)

## 2020-04-10 MED ORDER — BIMATOPROST 0.03 % EX SOLN: CUTANEOUS | 5 refills | Status: DC

## 2020-04-10 MED ORDER — TRETINOIN 0.025 % EX CREA: TOPICAL_CREAM | Freq: Every day | CUTANEOUS | 0 refills | Status: DC

## 2020-04-10 MED FILL — BIMATOPROST 0.03% EYELASH S: 0.03 | 50 days supply | Qty: 5 | Fill #0

## 2020-04-10 NOTE — Patient Instructions (Signed)
Give us 2-3 business days to get the results of your labs back.   Keep the diet clean and stay active.  Let us know if you need anything. 

## 2020-04-10 NOTE — Progress Notes (Signed)
Chief Complaint  Patient presents with  . Annual Exam     Well Woman Story Conti is here for a complete physical.   Her last physical was >1 year ago.  Current diet: in general, a "pretty good" diet. Current exercise: some cardio, playing with kids.  +some fatigue, has 3 young girls at home No LMP recorded. (Menstrual status: IUD). Seatbelt? Yes  Health Maintenance Pap/HPV- Yes Tetanus- Yes HIV screening- Yes  Past Medical History:  Diagnosis Date  . Anemia   . History of gestational hypertension   . Hx of varicella      Past Surgical History:  Procedure Laterality Date  . APPENDECTOMY    . WISDOM TOOTH EXTRACTION     Medications  Current Outpatient Medications on File Prior to Visit  Medication Sig Dispense Refill  . bimatoprost (LATISSE) 0.03 % ophthalmic solution Place one drop on applicator and apply evenly along the skin of the upper eyelid at base of eyelashes once daily at bedtime; repeat procedure for second eye (use a clean applicator). 3 mL 1   Allergies No Known Allergies   Review of Systems: Constitutional:  no unexpected weight changes Eye:  no recent significant change in vision Ear/Nose/Mouth/Throat:  Ears:  no tinnitus or vertigo and no recent change in hearing Nose/Mouth/Throat:  no complaints of nasal congestion, no sore throat Cardiovascular: no chest pain Respiratory:  no cough and no shortness of breath Gastrointestinal:  no abdominal pain, no change in bowel habits GU:  Female: negative for dysuria or pelvic pain Musculoskeletal/Extremities:  no pain of the joints Integumentary (Skin/Breast):  no abnormal skin lesions reported Neurologic:  no headaches Endocrine:  + fatigue Hematologic/Lymphatic:  No areas of easy bleeding  Exam BP 118/70 (BP Location: Right Arm, Cuff Size: Normal)   Pulse 71   Temp 98.1 F (36.7 C) (Temporal)   Resp 12   Ht 5\' 4"  (1.626 m)   Wt 142 lb 3.2 oz (64.5 kg)   SpO2 99%   BMI 24.41 kg/m  General:  well  developed, well nourished, in no apparent distress Skin:  no significant moles, warts, or growths Head:  no masses, lesions, or tenderness Eyes:  pupils equal and round, sclera anicteric without injection Ears:  canals without lesions, TMs shiny without retraction, no obvious effusion, no erythema Nose:  nares patent, septum midline, mucosa normal, and no drainage or sinus tenderness Throat/Pharynx:  lips and gingiva without lesion; tongue and uvula midline; non-inflamed pharynx; no exudates or postnasal drainage Neck: neck supple without adenopathy, thyromegaly, or masses Lungs:  clear to auscultation, breath sounds equal bilaterally, no respiratory distress Cardio:  regular rate and rhythm, no bruits, no LE edema Abdomen:  abdomen soft, nontender; bowel sounds normal; no masses or organomegaly Genital: Defer to GYN Musculoskeletal:  symmetrical muscle groups noted without atrophy or deformity Extremities:  no clubbing, cyanosis, or edema, no deformities, no skin discoloration Neuro:  gait normal; deep tendon reflexes normal and symmetric Psych: well oriented with normal range of affect and appropriate judgment/insight  Assessment and Plan  Well adult exam - Plan: Lipid panel, CBC, Comprehensive metabolic panel, VITAMIN D 25 Hydroxy (Vit-D Deficiency, Fractures), TSH  Encounter for hepatitis C screening test for low risk patient - Plan: Hepatitis C antibody  Liver lesion - Plan: US Abdomen Limited RUQ   Well 39 y.o. female. Counseled on diet and exercise. Other orders as above.   F/u on RUQ [place] from 05/02/2019. Likely benign.  Follow up in 1 yr or  prn. The patient voiced understanding and agreement to the plan.  [place], DO 04/10/20 9:51 AM

## 2020-04-11 LAB — HEPATITIS C ANTIBODY
Hepatitis C Ab: NONREACTIVE
SIGNAL TO CUT-OFF: 0.01 (ref <1.00)

## 2020-04-18 ENCOUNTER — Other Ambulatory Visit: Payer: Self-pay

## 2020-04-18 ENCOUNTER — Ambulatory Visit (HOSPITAL_BASED_OUTPATIENT_CLINIC_OR_DEPARTMENT_OTHER)
Admission: RE | Admit: 2020-04-18 | Discharge: 2020-04-18 | Disposition: A | Discharge status: Home / Self Care | Payer: No Typology Code available for payment source | Source: Ambulatory Visit | Attending: Family Medicine | Admitting: Family Medicine

## 2020-04-18 DIAGNOSIS — K769 Liver disease, unspecified: Secondary | ICD-10-CM | POA: Insufficient documentation

## 2020-04-18 IMAGING — US US ABDOMEN LIMITED
1 series · 14 of 25 positions shown · non-contrast
Comparison: Ultrasound dated [DATE].

CLINICAL DATA: Liver lesion.

EXAM:
ULTRASOUND ABDOMEN LIMITED RIGHT UPPER QUADRANT

[Series 1: us abdomen limited · 14 of 94 slices shown]
[im 1/94]
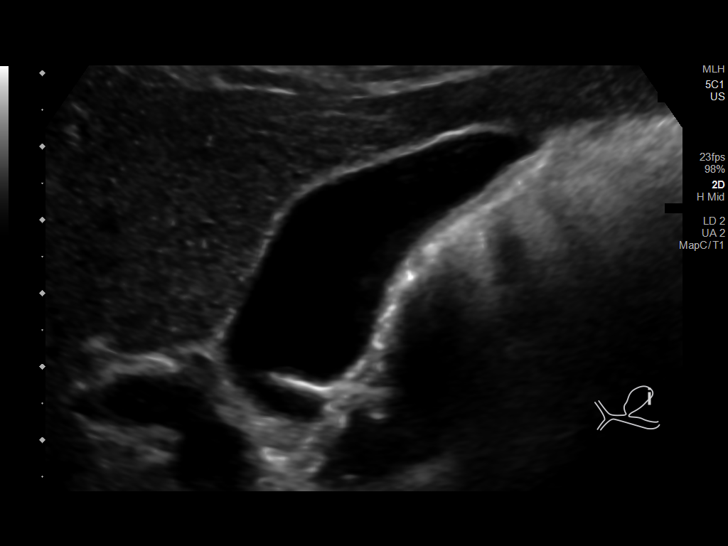
[im 8/94]
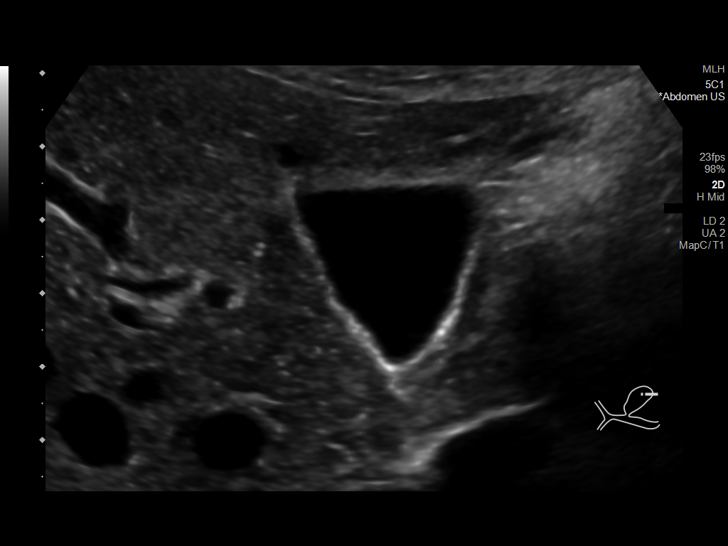
[im 16/94]
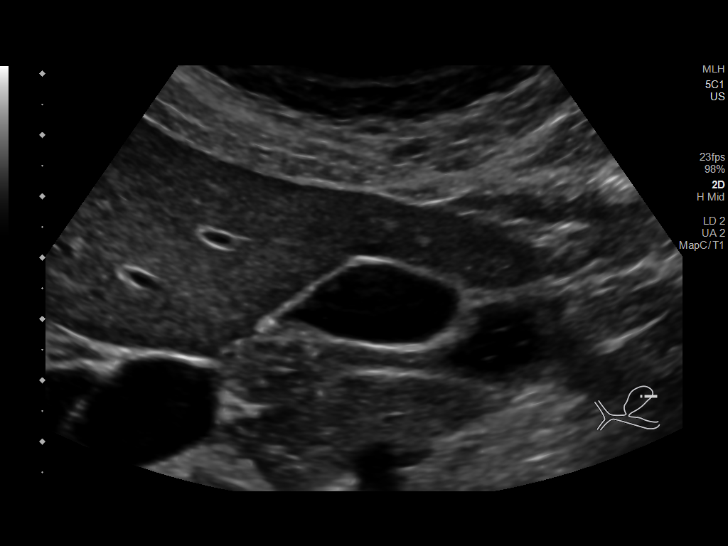
[im 24/94]
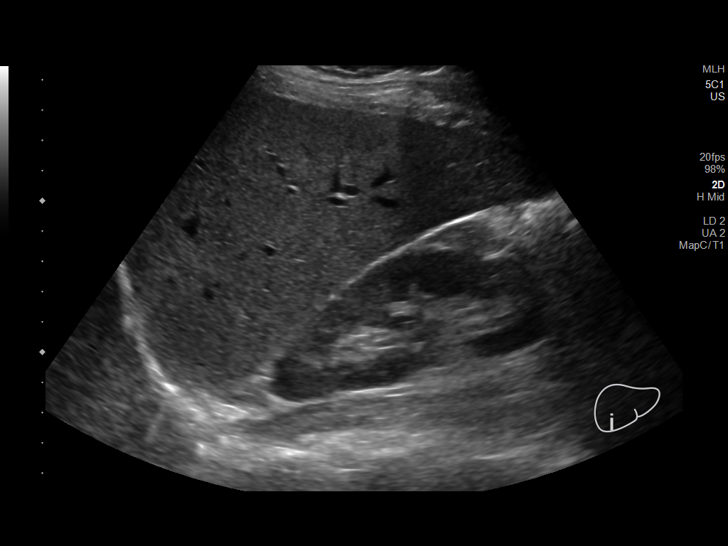
[im 32/94]
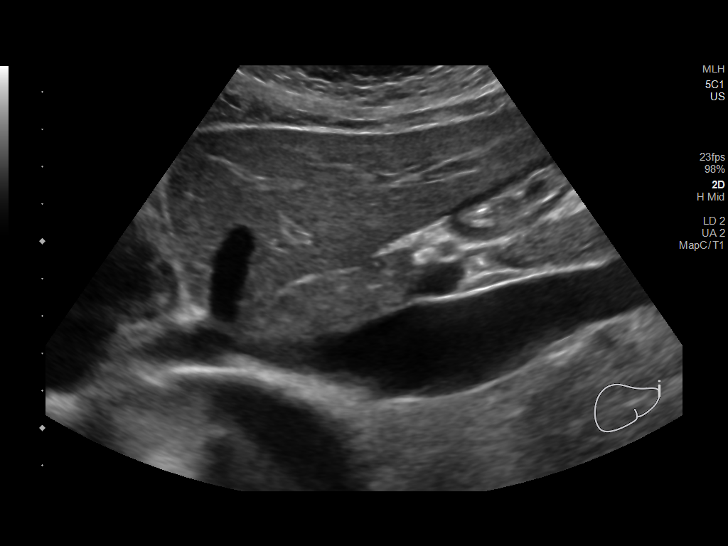
[im 35/94]
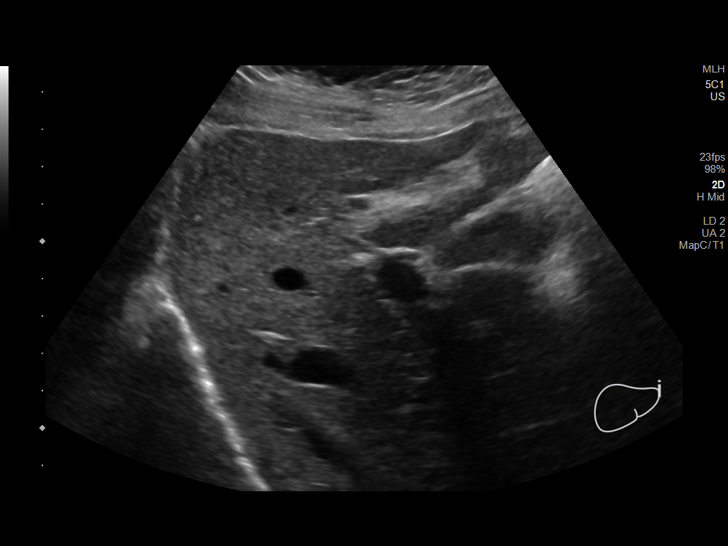
[im 43/94]
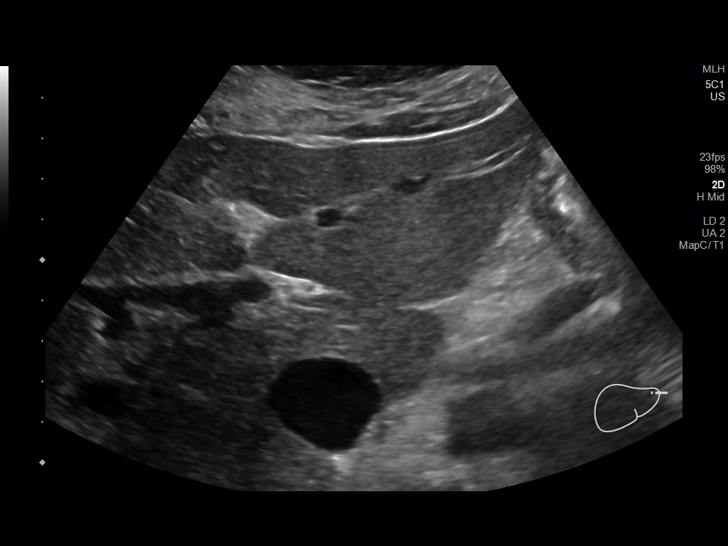
[im 51/94]
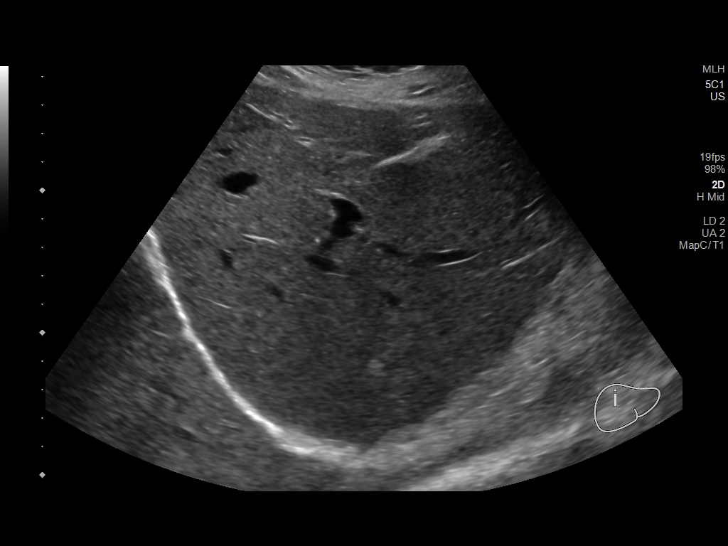
[im 59/94]
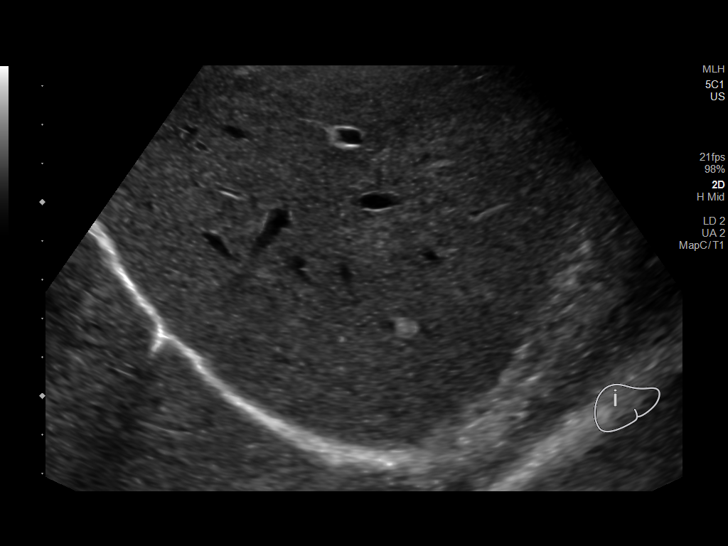
[im 63/94]
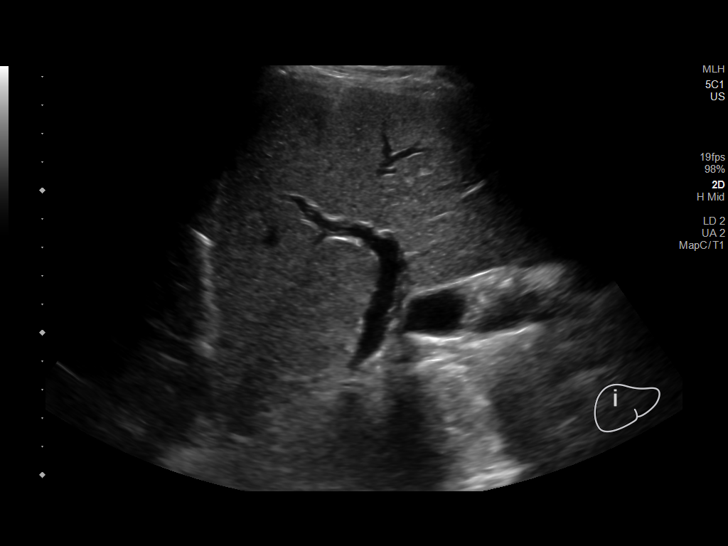
[im 70/94]
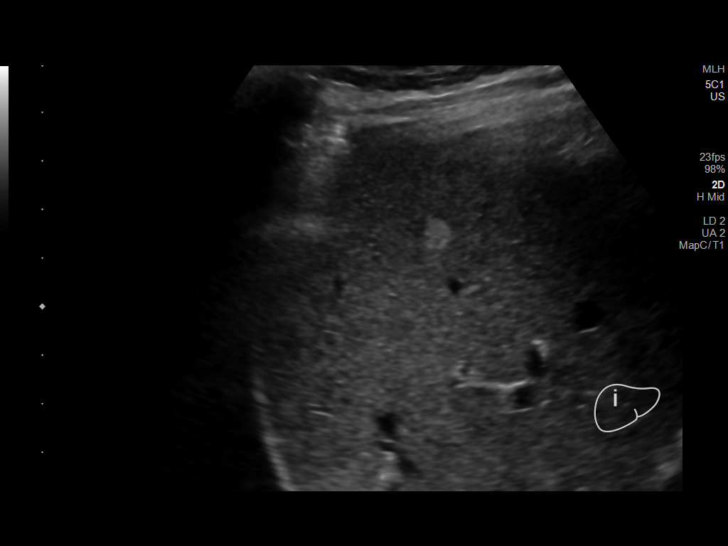
[im 78/94]
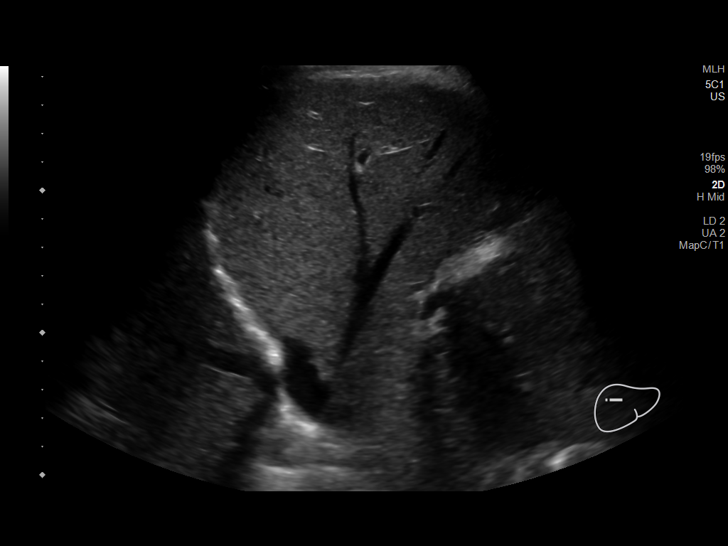
[im 86/94]
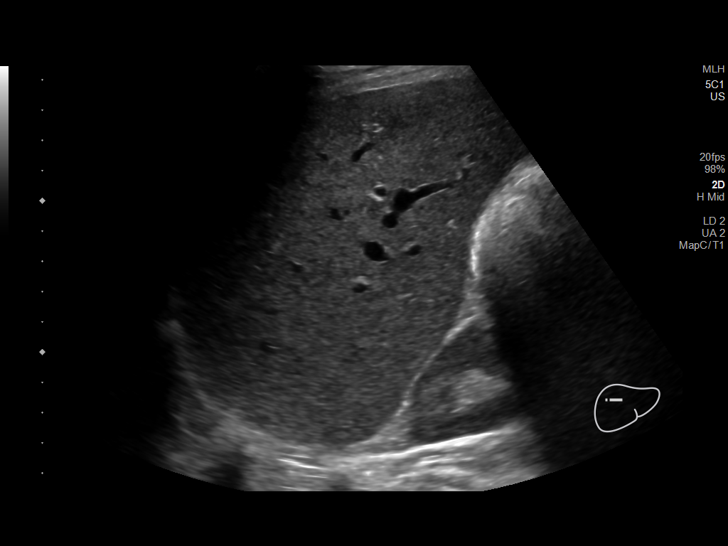
[im 94/94]
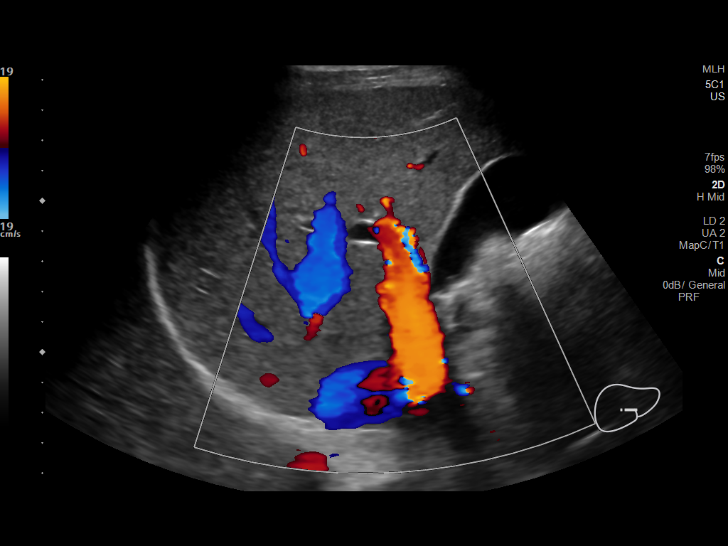

[14 of 25 positions shown; findings below may reference images not displayed]

FINDINGS: Gallbladder:

No gallstones or wall thickening visualized. No sonographic Murphy
sign noted by sonographer.

Common bile duct:

Diameter: 2.8 mm.

Liver:

Again noted are small echogenic liver lesions measuring to
approximately 7 mm. These appear to be relatively stable since prior
study. The previously demonstrated cystic lesion is not well
appreciated acute on today's study. Portal vein is patent on color
Doppler imaging with normal direction of blood flow towards the
liver.

Other: None.
IMPRESSION: Stable echogenic liver lesions favored to represent benign
hemangiomas. The previously demonstrated complex cyst is not well
appreciated on today's study.

## 2020-05-15 MED FILL — RETIN-A 0.05% CREAM: 0.05 | 30 days supply | Qty: 45 | Fill #0

## 2020-05-23 ENCOUNTER — Other Ambulatory Visit: Payer: Self-pay

## 2020-05-23 ENCOUNTER — Emergency Department
Admission: RE | Admit: 2020-05-23 | Discharge: 2020-05-23 | Disposition: A | Discharge status: Home / Self Care | Payer: No Typology Code available for payment source | Source: Ambulatory Visit | Attending: Family Medicine | Admitting: Family Medicine

## 2020-05-23 VITALS — BP 138/92 | HR 81 | Temp 98.5°F | Resp 16

## 2020-05-23 DIAGNOSIS — R197 Diarrhea, unspecified: Secondary | ICD-10-CM

## 2020-05-23 DIAGNOSIS — R110 Nausea: Secondary | ICD-10-CM

## 2020-05-23 LAB — POCT CBC W AUTO DIFF (K'VILLE URGENT CARE)

## 2020-05-23 LAB — POCT URINALYSIS DIP (MANUAL ENTRY)
Glucose, UA: NEGATIVE mg/dL
Nitrite, UA: NEGATIVE
Protein Ur, POC: 30 mg/dL — AB
Spec Grav, UA: 1.025 (ref 1.010–1.025)
Urobilinogen, UA: 1 E.U./dL
pH, UA: 5.5 (ref 5.0–8.0)

## 2020-05-23 MED ORDER — PROMETHAZINE HCL 25 MG PO TABS: 25.0000 mg | ORAL_TABLET | Freq: Four times a day (QID) | ORAL | 0 refills | Status: DC | PRN

## 2020-05-23 NOTE — ED Triage Notes (Signed)
Patient presents to Urgent Care with complaints of fatigue, vomiting since about 6 days ago. Patient reports she started having diarrhea 5 days ago as well. Has been taking zofran for nausea, nothing for the diarrhea.

## 2020-05-23 NOTE — ED Provider Notes (Signed)
[facility]    CSN: [id number] Arrival date & time: 05/23/20  1508      History   Chief Complaint Chief Complaint  Patient presents with  . Appointment    3:00  . Emesis    HPI Stephanie Prince is a 39 y.o. female.   Patient developed some mild nasal congestion four days ago.  Two days ago she developed fatigue, myalgias, headache, nausea, occasional vomiting, and watery diarrhea.  She has developed no additional URI symptoms.   She denies chest tightness, shortness of breath, and changes in taste/smell.  She reports that her children are mildly ill with similar symptoms. She states that she has a COVID test pending at [facility].   The history is provided by the patient.    Past Medical History:  Diagnosis Date  . Anemia   . History of gestational hypertension   . Hx of varicella     Patient Active Problem List   Diagnosis Date Noted  . Gestational hypertension without significant proteinuria during pregnancy in third trimester, antepartum 03/01/2019  . IUGR (intrauterine growth restriction) affecting care of mother, third trimester, fetus 1 11/14/2016  . NSVD (normal spontaneous vaginal delivery) 11/14/2016  . Severe preeclampsia 08/14/2015    Past Surgical History:  Procedure Laterality Date  . APPENDECTOMY    . WISDOM TOOTH EXTRACTION      OB History    Gravida  4   Para  3   Term  2   Preterm  1   AB  1   Living  3     SAB  1   TAB      Ectopic      Multiple  0   Live Births  3            Home Medications    Prior to Admission medications   Medication Sig Start Date End Date Taking? Authorizing Provider  bimatoprost (LATISSE) 0.03 % ophthalmic solution Place one drop on applicator and apply evenly along the skin of the upper eyelid at base of eyelashes once daily at bedtime; repeat procedure for second eye (use a clean applicator). 04/10/20   Shelda Pal, DO  promethazine (PHENERGAN) 25 MG tablet Take 1 tablet (25 mg  total) by mouth every 6 (six) hours as needed for nausea or vomiting. 05/23/20   Kandra Nicolas, MD  tretinoin (RETIN-A) 0.025 % cream Apply topically at bedtime. 04/10/20   Shelda Pal, DO    Family History Family History  Problem Relation Age of Onset  . Hypertension Mother   . Other Father        brain tumor  . Cancer Father   . Hypertension Maternal Grandmother   . Heart failure Maternal Grandmother   . Alcohol abuse Maternal Grandfather   . Cancer Paternal Grandfather   . Breast cancer Maternal Aunt   . Cancer Maternal Uncle   . Cancer Maternal Aunt     Social History Social History   Tobacco Use  . Smoking status: Never Smoker  . Smokeless tobacco: Never Used  Vaping Use  . Vaping Use: Never used  Substance Use Topics  . Alcohol use: No  . Drug use: No     Allergies   Patient has no known allergies.   Review of Systems Review of Systems  No sore throat No cough No pleuritic pain No wheezing + nasal congestion ? post-nasal drainage No sinus pain/pressure No itchy/red eyes No earache No hemoptysis No  SOB No fever/chills + nausea + vomiting No abdominal pain + diarrhea No urinary symptoms No skin rash + fatigue + myalgias + headache     Physical Exam Triage Vital Signs ED Triage Vitals  Enc Vitals Group     BP 05/23/20 1525 (!) 138/92     Pulse Rate 05/23/20 1525 81     Resp 05/23/20 1525 16     Temp 05/23/20 1525 98.5 F (36.9 C)     Temp Source 05/23/20 1525 Oral     SpO2 05/23/20 1525 99 %     Weight --      Height --      Head Circumference --      Peak Flow --      Pain Score 05/23/20 1523 0     Pain Loc --      Pain Edu? --      Excl. in [place]? --    No data found.  Updated Vital Signs BP (!) 138/92 (BP Location: Right Arm)   Pulse 81   Temp 98.5 F (36.9 C) (Oral)   Resp 16   SpO2 99%   Visual Acuity Right Eye Distance:   Left Eye Distance:   Bilateral Distance:    Right Eye Near:   Left Eye Near:     Bilateral Near:     Physical Exam Nursing notes and Vital Signs reviewed. Appearance:  Patient appears stated age, and in no acute distress Eyes:  Pupils are equal, round, and reactive to light and accomodation.  Extraocular movement is intact.  Conjunctivae are not inflamed  Ears:  Canals normal.  Tympanic membranes normal.  Nose:  Normal turbinates.  No sinus tenderness.   Pharynx:  Normal; moist mucous membranes  Neck:  Supple.  No adenopathy.  Lungs:  Clear to auscultation.  Breath sounds are equal.  Moving air well. Heart:  Regular rate and rhythm without murmurs, rubs, or gallops.  Abdomen:  Nontender without masses or hepatosplenomegaly.  Bowel sounds are present.  No CVA or flank tenderness.  Extremities:  No edema.  Skin:  No rash present.   UC Treatments / Results  Labs (all labs ordered are listed, but only abnormal results are displayed) Labs Reviewed  POCT URINALYSIS DIP (MANUAL ENTRY) - Abnormal; Notable for the following components:      Result Value   Color, UA straw (*)    Clarity, UA cloudy (*)    Bilirubin, UA small (*)    Ketones, POC UA small (15) (*)    Blood, UA trace-lysed (*)    Protein Ur, POC =30 (*)    Leukocytes, UA Small (1+) (*)    All other components within normal limits  POCT CBC W AUTO DIFF ([facility]):  WBC 8.6; LY 6.0; MO 7.4; GR 86.6; Hgb 14.4; Platelets 196     EKG   Radiology No results found.  Procedures Procedures (including critical care time)  Medications Ordered in UC Medications - No data to display  Initial Impression / Assessment and Plan / UC Course  I have reviewed the triage vital signs and the nursing notes.  Pertinent labs & imaging results that were available during my care of the patient were reviewed by me and considered in my medical decision making (see chart for details).    Unremarkable CBC.  Benign exam; suspect viral gastroenteritis. Note [facility]19 test [facility]. Followup with Family  Doctor if not improved in about 5 days.   Final Clinical  Impressions(s) / UC Diagnoses   Final diagnoses:  Nausea without vomiting  Diarrhea, unspecified type     Discharge Instructions     Begin Pedialyte for about hours until diarrhea stops, then switch to clear liquids (apple juice, clear grape juice, Jello, etc). When improved, advance to a [employer] (Bananas, Rice, Applesauce, Toast). Then gradually resume a regular diet when tolerated.  Avoid milk products until well.  To decrease diarrhea, mix one teaspoon Citrucel (methylcellulose) in 2 oz water and drink one to three times daily.  Do not drink extra fluids with this dose and do not drink fluids for one hour afterwards.  When stools become more formed, may take Imodium (loperamide) once or twice daily to decrease stool frequency.    If symptoms become significantly worse during the night or over the weekend, proceed to the local emergency room.       ED Prescriptions    Medication Sig Dispense Auth. Provider   promethazine (PHENERGAN) 25 MG tablet Take 1 tablet (25 mg total) by mouth every 6 (six) hours as needed for nausea or vomiting. 12 tablet Kandra Nicolas, MD        Kandra Nicolas, MD 05/27/20 1014

## 2020-05-23 NOTE — Discharge Instructions (Addendum)
Begin Pedialyte for about hours until diarrhea stops, then switch to clear liquids (apple juice, clear grape juice, Jello, etc). When improved, advance to a [employer] (Bananas, Rice, Applesauce, Toast). Then gradually resume a regular diet when tolerated.  Avoid milk products until well.  To decrease diarrhea, mix one teaspoon Citrucel (methylcellulose) in 2 oz water and drink one to three times daily.  Do not drink extra fluids with this dose and do not drink fluids for one hour afterwards.  When stools become more formed, may take Imodium (loperamide) once or twice daily to decrease stool frequency.    If symptoms become significantly worse during the night or over the weekend, proceed to the local emergency room.

## 2020-05-24 ENCOUNTER — Other Ambulatory Visit: Payer: Self-pay

## 2020-05-24 ENCOUNTER — Emergency Department (HOSPITAL_BASED_OUTPATIENT_CLINIC_OR_DEPARTMENT_OTHER)
Admission: EM | Admit: 2020-05-24 | Discharge: 2020-05-24 | Disposition: A | Discharge status: Home / Self Care | Payer: No Typology Code available for payment source | Attending: Emergency Medicine | Admitting: Emergency Medicine

## 2020-05-24 ENCOUNTER — Emergency Department (HOSPITAL_BASED_OUTPATIENT_CLINIC_OR_DEPARTMENT_OTHER): Payer: No Typology Code available for payment source

## 2020-05-24 ENCOUNTER — Encounter (HOSPITAL_BASED_OUTPATIENT_CLINIC_OR_DEPARTMENT_OTHER): Payer: Self-pay

## 2020-05-24 DIAGNOSIS — R1013 Epigastric pain: Secondary | ICD-10-CM | POA: Diagnosis present

## 2020-05-24 DIAGNOSIS — K529 Noninfective gastroenteritis and colitis, unspecified: Secondary | ICD-10-CM | POA: Insufficient documentation

## 2020-05-24 LAB — COMPREHENSIVE METABOLIC PANEL
ALT: 11 U/L (ref 0–44)
AST: 15 U/L (ref 15–41)
Albumin: 4.4 g/dL (ref 3.5–5.0)
Alkaline Phosphatase: 52 U/L (ref 38–126)
Anion gap: 11 (ref 5–15)
BUN: 14 mg/dL (ref 6–20)
CO2: 24 mmol/L (ref 22–32)
Calcium: 9.4 mg/dL (ref 8.9–10.3)
Chloride: 102 mmol/L (ref 98–111)
Creatinine, Ser: 0.79 mg/dL (ref 0.44–1.00)
GFR calc Af Amer: >60 mL/min (ref >60)
GFR calc non Af Amer: >60 mL/min (ref >60)
Glucose, Bld: 99 mg/dL (ref 70–99)
Potassium: 3.7 mmol/L (ref 3.5–5.1)
Sodium: 137 mmol/L (ref 135–145)
Total Bilirubin: 0.4 mg/dL (ref 0.3–1.2)
Total Protein: 7.5 g/dL (ref 6.5–8.1)

## 2020-05-24 LAB — CBC WITH DIFFERENTIAL/PLATELET
Abs Immature Granulocytes: 0.02 10*3/uL (ref 0.00–0.07)
Basophils Absolute: 0 10*3/uL (ref 0.0–0.1)
Basophils Relative: 1 %
Eosinophils Absolute: 0 10*3/uL (ref 0.0–0.5)
Eosinophils Relative: 0 %
HCT: 42.3 % (ref 36.0–46.0)
Hemoglobin: 14.2 g/dL (ref 12.0–15.0)
Immature Granulocytes: 1 %
Lymphocytes Relative: 10 %
Lymphs Abs: 0.4 10*3/uL — ABNORMAL LOW (ref 0.7–4.0)
MCH: 29.7 pg (ref 26.0–34.0)
MCHC: 33.6 g/dL (ref 30.0–36.0)
MCV: 88.5 fL (ref 80.0–100.0)
Monocytes Absolute: 0.4 10*3/uL (ref 0.1–1.0)
Monocytes Relative: 10 %
Neutro Abs: 3.4 10*3/uL (ref 1.7–7.7)
Neutrophils Relative %: 78 %
Platelets: 205 10*3/uL (ref 150–400)
RBC: 4.78 MIL/uL (ref 3.87–5.11)
RDW: 13 % (ref 11.5–15.5)
WBC: 4.3 10*3/uL (ref 4.0–10.5)
nRBC: 0 % (ref 0.0–0.2)

## 2020-05-24 LAB — LIPASE, BLOOD: Lipase: 26 U/L (ref 11–51)

## 2020-05-24 LAB — URINALYSIS, ROUTINE W REFLEX MICROSCOPIC
Glucose, UA: NEGATIVE mg/dL
Ketones, ur: 40 mg/dL — AB
Leukocytes,Ua: NEGATIVE
Nitrite: NEGATIVE
Protein, ur: 30 mg/dL — AB
Specific Gravity, Urine: >1.03 — ABNORMAL HIGH (ref 1.005–1.030)
pH: 5.5 (ref 5.0–8.0)

## 2020-05-24 LAB — URINALYSIS, MICROSCOPIC (REFLEX)

## 2020-05-24 LAB — PREGNANCY, URINE: Preg Test, Ur: NEGATIVE

## 2020-05-24 MED ORDER — FAMOTIDINE IN NACL 20-0.9 MG/50ML-% IV SOLN
20.0000 mg | Freq: Once | INTRAVENOUS | Status: AC
  Administered 2020-05-24: 20 mg via INTRAVENOUS
  Filled 2020-05-24: qty 50

## 2020-05-24 MED ORDER — ONDANSETRON 4 MG PO TBDP: 4.0000 mg | ORAL_TABLET | Freq: Three times a day (TID) | ORAL | 0 refills | Status: DC | PRN

## 2020-05-24 MED ORDER — METOCLOPRAMIDE HCL 5 MG/ML IJ SOLN
10.0000 mg | Freq: Once | INTRAMUSCULAR | Status: AC
  Administered 2020-05-24: 10 mg via INTRAVENOUS
  Filled 2020-05-24: qty 2

## 2020-05-24 MED ORDER — SODIUM CHLORIDE 0.9 % IV BOLUS
1000.0000 mL | Freq: Once | INTRAVENOUS | Status: AC
  Administered 2020-05-24: 1000 mL via INTRAVENOUS

## 2020-05-24 MED FILL — ONDANSETRON ODT 4MG TBDP: 4 | 4 days supply | Qty: 10 | Fill #0

## 2020-05-24 NOTE — ED Provider Notes (Signed)
[facility] EMERGENCY DEPARTMENT Provider Note   CSN: [id number] Arrival date & time: 05/24/20  0907     History Chief Complaint  Patient presents with  . Abdominal Pain    Stephanie Prince is a 39 y.o. female.  HPI 39 year old female presents with vomiting, diarrhea, abdominal pain.  Started off on 8/2 with fatigue and headache and maybe some chills.  Had a Covid test earlier in the week that is negative.  2 days ago she developed the vomiting and diarrhea which has continued.  Is having nausea.  Is having constant epigastric pain that feels like a burning sensation and is about 5/10.  Worsens with vomiting.  Took some Pepcid.  Diarrhea is more of a yellow with no blood in her emesis or diarrhea.  No fevers.  Has been taking Phenergan and Zofran with no relief.  Has history of gallbladder problems in the family and is worried about her gallbladder.   Past Medical History:  Diagnosis Date  . Anemia   . History of gestational hypertension   . Hx of varicella     Patient Active Problem List   Diagnosis Date Noted  . Gestational hypertension without significant proteinuria during pregnancy in third trimester, antepartum 03/01/2019  . IUGR (intrauterine growth restriction) affecting care of mother, third trimester, fetus 1 11/14/2016  . NSVD (normal spontaneous vaginal delivery) 11/14/2016  . Severe preeclampsia 08/14/2015    Past Surgical History:  Procedure Laterality Date  . APPENDECTOMY    . WISDOM TOOTH EXTRACTION       OB History    Gravida  4   Para  3   Term  2   Preterm  1   AB  1   Living  3     SAB  1   TAB      Ectopic      Multiple  0   Live Births  3           Family History  Problem Relation Age of Onset  . Hypertension Mother   . Other Father        brain tumor  . Cancer Father   . Hypertension Maternal Grandmother   . Heart failure Maternal Grandmother   . Alcohol abuse Maternal Grandfather   . Cancer Paternal  Grandfather   . Breast cancer Maternal Aunt   . Cancer Maternal Uncle   . Cancer Maternal Aunt     Social History   Tobacco Use  . Smoking status: Never Smoker  . Smokeless tobacco: Never Used  Vaping Use  . Vaping Use: Never used  Substance Use Topics  . Alcohol use: No  . Drug use: No    Home Medications Prior to Admission medications   Medication Sig Start Date End Date Taking? Authorizing Provider  bimatoprost (LATISSE) 0.03 % ophthalmic solution Place one drop on applicator and apply evenly along the skin of the upper eyelid at base of eyelashes once daily at bedtime; repeat procedure for second eye (use a clean applicator). 04/10/20   Shelda Pal, DO  levonorgestrel (MIRENA, 52 MG,) 20 MCG/24HR IUD Mirena 20 mcg/24 hours (6 yrs) 52 mg intrauterine device  Take 1 device every day by intrauterine route.    [provider]  ondansetron (ZOFRAN ODT) 4 MG disintegrating tablet Take 1 tablet (4 mg total) by mouth every 8 (eight) hours as needed. 05/24/20   Sherwood Gambler, MD  promethazine (PHENERGAN) 25 MG tablet Take 1 tablet (25 mg total)  by mouth every 6 (six) hours as needed for nausea or vomiting. 05/23/20   Kandra Nicolas, MD  tretinoin (RETIN-A) 0.025 % cream Apply topically at bedtime. 04/10/20   Shelda Pal, DO    Allergies    Patient has no known allergies.  Review of Systems   Review of Systems  Constitutional: Negative for fever.  Gastrointestinal: Positive for abdominal pain, diarrhea, nausea and vomiting. Negative for blood in stool.  Genitourinary: Negative for dysuria.  Musculoskeletal: Negative for back pain.  All other systems reviewed and are negative.   Physical Exam Updated Vital Signs BP 109/75 (BP Location: Left Arm)   Pulse 82   Temp 98 F (36.7 C) (Oral)   Resp 16   Ht 5\' 4"  (1.626 m)   Wt 60.8 kg   SpO2 98%   BMI 23.00 kg/m   Physical Exam Vitals and nursing note reviewed.  Constitutional:      General:  She is not in acute distress.    Appearance: She is well-developed. She is not ill-appearing or diaphoretic.  HENT:     Head: Normocephalic and atraumatic.     Right Ear: External ear normal.     Left Ear: External ear normal.     Nose: Nose normal.  Eyes:     General:        Right eye: No discharge.        Left eye: No discharge.  Cardiovascular:     Rate and Rhythm: Normal rate and regular rhythm.     Heart sounds: Normal heart sounds.  Pulmonary:     Effort: Pulmonary effort is normal.     Breath sounds: Normal breath sounds.  Abdominal:     Palpations: Abdomen is soft.     Tenderness: There is abdominal tenderness (mild) in the epigastric area. There is no right CVA tenderness or left CVA tenderness.  Skin:    General: Skin is warm and dry.  Neurological:     Mental Status: She is alert.  Psychiatric:        Mood and Affect: Mood is not anxious.     ED Results / Procedures / Treatments   Labs (all labs ordered are listed, but only abnormal results are displayed) Labs Reviewed  CBC WITH DIFFERENTIAL/PLATELET - Abnormal; Notable for the following components:      Result Value   Lymphs Abs 0.4 (*)    All other components within normal limits  URINALYSIS, ROUTINE W REFLEX MICROSCOPIC - Abnormal; Notable for the following components:   Specific Gravity, Urine >1.030 (*)    Hgb urine dipstick SMALL (*)    Bilirubin Urine SMALL (*)    Ketones, ur 40 (*)    Protein, ur 30 (*)    All other components within normal limits  URINALYSIS, MICROSCOPIC (REFLEX) - Abnormal; Notable for the following components:   Bacteria, UA FEW (*)    All other components within normal limits  COMPREHENSIVE METABOLIC PANEL  PREGNANCY, URINE  LIPASE, BLOOD    EKG None  Radiology US Abdomen Limited RUQ  Result Date: 05/24/2020 CLINICAL DATA:  Epigastric pain, nausea, vomiting EXAM: ULTRASOUND ABDOMEN LIMITED RIGHT UPPER QUADRANT COMPARISON:  04/18/2020 FINDINGS: Gallbladder: No gallstones  or wall thickening visualized. No sonographic Murphy sign noted by sonographer. Common bile duct: Diameter: Normal caliber, 3 mm Liver: 2 small hyperechoic areas within the liver measuring up to 9 mm compatible with hemangiomas. No suspicious focal hepatic abnormality. Normal echotexture. No biliary ductal dilatation. Portal vein  is patent on color Doppler imaging with normal direction of blood flow towards the liver. Other: None. IMPRESSION: No acute findings or significant change since prior study. Electronically Signed   By: Rolm Baptise M.D.   On: 05/24/2020 11:25    Procedures Procedures (including critical care time)  Medications Ordered in ED Medications  sodium chloride 0.9 % bolus 1,000 mL (1,000 mLs Intravenous New Bag/Given 05/24/20 1051)  famotidine (PEPCID) IVPB 20 mg premix (20 mg Intravenous New Bag/Given 05/24/20 1125)  metoCLOPramide (REGLAN) injection 10 mg (10 mg Intravenous Given 05/24/20 1053)    ED Course  I have reviewed the triage vital signs and the nursing notes.  Pertinent labs & imaging results that were available during my care of the patient were reviewed by me and considered in my medical decision making (see chart for details).    MDM Rules/Calculators/A&P                          Patient's labs have been reviewed and are fairly unremarkable.  Right upper quadrant ultrasound also reviewed and unremarkable.  She does have some findings in her urine such as bilirubin, ketones, elevated specific gravity.  I think this is likely from dehydration.  She is feeling better after IV treatment in the ED.  She is asking for Rx for Zofran.  Otherwise abdominal exam is pretty benign and I think this is likely all viral gastroenteritis.  Do not think emergent abdominal cause is likely and do not think CT is needed. Final Clinical Impression(s) / ED Diagnoses Final diagnoses:  Epigastric abdominal pain  Acute gastroenteritis    Rx / DC Orders ED Discharge Orders          Ordered    ondansetron (ZOFRAN ODT) 4 MG disintegrating tablet  Every 8 hours PRN     Discontinue  Reprint     05/24/20 1253           Sherwood Gambler, MD 05/24/20 1305

## 2020-05-24 NOTE — ED Triage Notes (Signed)
Pt arrives with c/o NVD and epigastric pain X1 week. States that she has been diagnosed as Covid negative.

## 2020-05-24 NOTE — Discharge Instructions (Signed)
If you develop worsening, continued, or recurrent abdominal pain, uncontrolled vomiting, fever, chest or back pain, or any other new/concerning symptoms then return to the ER for evaluation.  

## 2020-10-17 NOTE — Telephone Encounter (Signed)
Are you able to send in medication ? [facility]

## 2020-10-18 ENCOUNTER — Other Ambulatory Visit: Payer: Self-pay | Admitting: Family Medicine

## 2020-10-18 ENCOUNTER — Ambulatory Visit: Payer: No Typology Code available for payment source | Admitting: Family Medicine

## 2020-10-18 MED ORDER — NITROFURANTOIN MONOHYD MACRO 100 MG PO CAPS
100.0000 mg | ORAL_CAPSULE | Freq: Two times a day (BID) | ORAL | 0 refills | Status: DC
Start: 2020-10-18 — End: 2020-10-18

## 2020-10-18 MED FILL — NITROFURANTOIN MONO-MCR 100: 100 | 5 days supply | Qty: 10 | Fill #0

## 2020-10-18 NOTE — Telephone Encounter (Signed)
Script called in .. no openings for our office, [place] or [place]

## 2020-11-28 ENCOUNTER — Ambulatory Visit: Payer: No Typology Code available for payment source | Admitting: Family Medicine

## 2021-02-13 ENCOUNTER — Other Ambulatory Visit (HOSPITAL_BASED_OUTPATIENT_CLINIC_OR_DEPARTMENT_OTHER): Payer: Self-pay

## 2021-02-13 MED ORDER — TRETINOIN 0.05 % EX CREA
TOPICAL_CREAM | CUTANEOUS | 2 refills | Status: DC
  Filled 2021-02-13 (×2): qty 45, 30d supply, fill #0
  Filled 2021-03-25: qty 45, 30d supply, fill #1

## 2021-02-14 ENCOUNTER — Other Ambulatory Visit (HOSPITAL_BASED_OUTPATIENT_CLINIC_OR_DEPARTMENT_OTHER): Payer: Self-pay

## 2021-02-15 ENCOUNTER — Other Ambulatory Visit (HOSPITAL_BASED_OUTPATIENT_CLINIC_OR_DEPARTMENT_OTHER): Payer: Self-pay

## 2021-02-18 ENCOUNTER — Other Ambulatory Visit (HOSPITAL_BASED_OUTPATIENT_CLINIC_OR_DEPARTMENT_OTHER): Payer: Self-pay

## 2021-02-20 ENCOUNTER — Other Ambulatory Visit (HOSPITAL_BASED_OUTPATIENT_CLINIC_OR_DEPARTMENT_OTHER): Payer: Self-pay

## 2021-02-21 ENCOUNTER — Other Ambulatory Visit (HOSPITAL_BASED_OUTPATIENT_CLINIC_OR_DEPARTMENT_OTHER): Payer: Self-pay

## 2021-03-21 ENCOUNTER — Other Ambulatory Visit (HOSPITAL_COMMUNITY): Payer: Self-pay

## 2021-03-21 MED ORDER — CARESTART COVID-19 HOME TEST VI KIT
PACK | 0 refills | Status: DC
  Filled 2021-03-21: qty 4, 4d supply, fill #0

## 2021-03-25 ENCOUNTER — Other Ambulatory Visit (HOSPITAL_BASED_OUTPATIENT_CLINIC_OR_DEPARTMENT_OTHER): Payer: Self-pay

## 2021-03-26 ENCOUNTER — Other Ambulatory Visit (HOSPITAL_BASED_OUTPATIENT_CLINIC_OR_DEPARTMENT_OTHER): Payer: Self-pay

## 2021-04-16 ENCOUNTER — Telehealth: Payer: Self-pay

## 2021-04-16 NOTE — Telephone Encounter (Signed)
The patient called back/missed her call. Called the patient left a detailed message of PCP instructions.

## 2021-04-16 NOTE — Telephone Encounter (Signed)
I'm OK to see her, there may be an office policy limiting her though. If not, that's fine with me. Ty.

## 2021-04-16 NOTE — Telephone Encounter (Signed)
Called the patient left message to call back 

## 2021-04-16 NOTE — Telephone Encounter (Signed)
Pt asks if she needs to r/s her cpe for tomorrow. She has congestion, HA, and nausea. Denies fevers, has taken an at home covid test and was negative.

## 2021-04-17 ENCOUNTER — Other Ambulatory Visit: Payer: Self-pay

## 2021-04-17 ENCOUNTER — Ambulatory Visit (INDEPENDENT_AMBULATORY_CARE_PROVIDER_SITE_OTHER): Payer: No Typology Code available for payment source | Admitting: Family Medicine

## 2021-04-17 ENCOUNTER — Other Ambulatory Visit (HOSPITAL_BASED_OUTPATIENT_CLINIC_OR_DEPARTMENT_OTHER): Payer: Self-pay

## 2021-04-17 ENCOUNTER — Encounter: Payer: Self-pay | Admitting: Family Medicine

## 2021-04-17 VITALS — BP 110/68 | HR 81 | Temp 97.9°F | Ht 64.0 in | Wt 143.1 lb

## 2021-04-17 DIAGNOSIS — Z0000 Encounter for general adult medical examination without abnormal findings: Secondary | ICD-10-CM

## 2021-04-17 DIAGNOSIS — M79642 Pain in left hand: Secondary | ICD-10-CM

## 2021-04-17 DIAGNOSIS — Z0001 Encounter for general adult medical examination with abnormal findings: Secondary | ICD-10-CM | POA: Diagnosis not present

## 2021-04-17 LAB — COMPREHENSIVE METABOLIC PANEL
ALT: 11 U/L (ref 0–35)
AST: 14 U/L (ref 0–37)
Albumin: 4.3 g/dL (ref 3.5–5.2)
Alkaline Phosphatase: 50 U/L (ref 39–117)
BUN: 12 mg/dL (ref 6–23)
CO2: 32 mEq/L (ref 19–32)
Calcium: 9.2 mg/dL (ref 8.4–10.5)
Chloride: 103 mEq/L (ref 96–112)
Creatinine, Ser: 0.79 mg/dL (ref 0.40–1.20)
GFR: 95.51 mL/min (ref >60.00)
Glucose, Bld: 95 mg/dL (ref 70–99)
Potassium: 4.4 mEq/L (ref 3.5–5.1)
Sodium: 140 mEq/L (ref 135–145)
Total Bilirubin: 0.4 mg/dL (ref 0.2–1.2)
Total Protein: 7.2 g/dL (ref 6.0–8.3)

## 2021-04-17 LAB — LIPID PANEL
Cholesterol: 144 mg/dL (ref 0–200)
HDL: 41.7 mg/dL (ref >39.00)
LDL Cholesterol: 89 mg/dL (ref 0–99)
NonHDL: 102.23
Total CHOL/HDL Ratio: 3
Triglycerides: 68 mg/dL (ref 0.0–149.0)
VLDL: 13.6 mg/dL (ref 0.0–40.0)

## 2021-04-17 LAB — CBC
HCT: 38.3 % (ref 36.0–46.0)
Hemoglobin: 12.8 g/dL (ref 12.0–15.0)
MCHC: 33.5 g/dL (ref 30.0–36.0)
MCV: 89.6 fl (ref 78.0–100.0)
Platelets: 212 10*3/uL (ref 150.0–400.0)
RBC: 4.27 Mil/uL (ref 3.87–5.11)
RDW: 13.1 % (ref 11.5–15.5)
WBC: 5.1 10*3/uL (ref 4.0–10.5)

## 2021-04-17 LAB — URIC ACID: Uric Acid, Serum: 2.5 mg/dL (ref 2.4–7.0)

## 2021-04-17 MED ORDER — FLUTICASONE PROPIONATE 50 MCG/ACT NA SUSP
2.0000 | Freq: Every day | NASAL | 5 refills | Status: DC
  Filled 2021-04-17: qty 16, 30d supply, fill #0

## 2021-04-17 NOTE — Progress Notes (Signed)
Chief Complaint  Patient presents with   Annual Exam     Well Woman Stephanie Prince is here for a complete physical.   Her last physical was >1 year ago.  Current diet: in general, a "healthy" diet. Current exercise: lifting, cardio. Fatigue out of ordinary? No Seatbelt? Yes  Health Maintenance Pap/HPV- Yes Tetanus- Yes HIV screening- Yes STI screening- Yes  Left hand pain  3 mo ago she had pain that started in her pinky.  There is no injury or change in activity.  It migrated to the rest of her PIP joints from digits 2-5.  It is most bothersome in the index finger at this time.  She has some swelling and slight decreased range of motion.  No bruising, redness, or weakness.  She tried Voltaren gel with some relief and oral NSAIDs.  Her right hand is unaffected.  Past Medical History:  Diagnosis Date   Anemia    History of gestational hypertension    Hx of varicella      Past Surgical History:  Procedure Laterality Date   APPENDECTOMY     WISDOM TOOTH EXTRACTION      Medications  Current Outpatient Medications on File Prior to Visit  Medication Sig Dispense Refill   bimatoprost (LATISSE) 0.03 % ophthalmic solution Place one drop on applicator and apply evenly along the skin of the upper eyelid at base of eyelashes once daily at bedtime; repeat procedure for second eye (use a clean applicator). 3 mL 5   COVID-19 At Home Antigen Test ([facility] COVID-19 HOME TEST) KIT use as directed 4 each 0   levonorgestrel (MIRENA, 52 MG,) 20 MCG/24HR IUD Mirena 20 mcg/24 hours (6 yrs) 52 mg intrauterine device  Take 1 device every day by intrauterine route.     ondansetron (ZOFRAN ODT) 4 MG disintegrating tablet Take 1 tablet (4 mg total) by mouth every 8 (eight) hours as needed. 10 tablet 0   tretinoin (RETIN-A) 0.05 % cream APPLY 1 DIME-SIZED APPLICATION TO THE ENTIRE FACE NIGHTLY AS TOLERATED 45 g 2    Allergies No Known Allergies  Review of Systems: Constitutional:  no unexpected  weight changes Eye:  no recent significant change in vision Ear/Nose/Mouth/Throat:  Ears:  no tinnitus or vertigo and no recent change in hearing Nose/Mouth/Throat:  no complaints of nasal congestion, no sore throat Cardiovascular: no chest pain Respiratory:  no cough and no shortness of breath Gastrointestinal:  no abdominal pain, no change in bowel habits GU:  Female: negative for dysuria or pelvic pain Musculoskeletal/Extremities:  + Hand pain as above Integumentary (Skin/Breast):  no abnormal skin lesions reported Neurologic:  no headaches Endocrine:  denies fatigue Hematologic/Lymphatic:  No areas of easy bleeding  Exam BP 110/68   Pulse 81   Temp 97.9 F (36.6 C) (Oral)   Ht _0  (1.626 m)   Wt 143 lb 2 oz (64.9 kg)   SpO2 98%   BMI 24.57 kg/m  General:  well developed, well nourished, in no apparent distress Skin:  no significant moles, warts, or growths Head:  no masses, lesions, or tenderness Eyes:  pupils equal and round, sclera anicteric without injection Ears:  canals without lesions, TMs shiny without retraction, no obvious effusion, no erythema Nose:  nares patent, septum midline, mucosa normal, and no drainage or sinus tenderness Throat/Pharynx:  lips and gingiva without lesion; tongue and uvula midline; non-inflamed pharynx; no exudates or postnasal drainage Neck: neck supple without adenopathy, thyromegaly, or masses Lungs:  clear to  auscultation, breath sounds equal bilaterally, no respiratory distress Cardio:  regular rate and rhythm, no bruits, no LE edema Abdomen:  abdomen soft, nontender; bowel sounds normal; no masses or organomegaly Genital: Defer to GYN Musculoskeletal:  + TTP over the second PIP on the left with some edema; no gross deformity, erythema, ecchymosis Neuro:  gait normal; grip strength adequate, deep tendon reflexes normal and symmetric Psych: well oriented with normal range of affect and appropriate judgment/insight  Assessment and  Plan  Well adult exam - Plan: CBC, Comprehensive metabolic panel, Lipid panel  Pain of left hand - Plan: Lipid panel, Uric acid, Antinuclear Antib (ANA), Cyclic citrul peptide antibody, IgG (QUEST), Rheumatoid Factor, Anti-Smith antibody, Anti-DNA antibody, double-stranded, Sjogrens syndrome-A extractable nuclear antibody, Sjogrens syndrome-B extractable nuclear antibody, Aldolase   Well 40 y.o. female. Counseled on diet and exercise. Other orders as above. Status: New problem, uncertain prognosis.  Check rheumatologic labs.  If unremarkable and she is still having problems, we will set her up with a sports medicine team.  Ice, Tylenol, anti-inflammatories as needed. Follow up in 1 yr or prn. The patient voiced understanding and agreement to the plan.  [place], DO 04/17/21 9:29 AM

## 2021-04-17 NOTE — Patient Instructions (Addendum)
Give [place] 5-6 business days to get the results of your labs back.   Keep the diet clean and stay active.  Ice/cold pack over area for 10-15 min twice daily.  OK to take Tylenol 1000 mg (2 extra strength tabs) or 975 mg (3 regular strength tabs) every 6 hours as needed.  Foods that may reduce pain: 1) Ginger 2) Blueberries 3) Salmon 4) Pumpkin seeds 5) dark chocolate 6) turmeric 7) tart cherries 8) virgin olive oil 9) chilli peppers 10) mint 11) red wine  Let us know if you need anything.

## 2021-04-18 ENCOUNTER — Other Ambulatory Visit (HOSPITAL_COMMUNITY): Payer: Self-pay

## 2021-04-18 LAB — ALDOLASE: Aldolase: 3.6 U/L (ref <=8.1)

## 2021-04-18 MED ORDER — CARESTART COVID-19 HOME TEST VI KIT
PACK | 0 refills | Status: DC
  Filled 2021-04-18: qty 4, 4d supply, fill #0

## 2021-04-19 ENCOUNTER — Other Ambulatory Visit (HOSPITAL_COMMUNITY): Payer: Self-pay

## 2021-04-19 LAB — RHEUMATOID FACTOR: Rheumatoid fact SerPl-aCnc: <14 IU/mL (ref <14)

## 2021-04-19 LAB — SJOGRENS SYNDROME-B EXTRACTABLE NUCLEAR ANTIBODY: SSB (La) (ENA) Antibody, IgG: <1 AI

## 2021-04-19 LAB — ANA: Anti Nuclear Antibody (ANA): NEGATIVE

## 2021-04-19 LAB — CYCLIC CITRUL PEPTIDE ANTIBODY, IGG: Cyclic Citrullin Peptide Ab: <16 UNITS

## 2021-04-19 LAB — ANTI-SMITH ANTIBODY: ENA SM Ab Ser-aCnc: <1 AI

## 2021-04-19 LAB — ANTI-DNA ANTIBODY, DOUBLE-STRANDED: ds DNA Ab: <1 IU/mL

## 2021-04-19 LAB — SJOGRENS SYNDROME-A EXTRACTABLE NUCLEAR ANTIBODY: SSA (Ro) (ENA) Antibody, IgG: <1 AI

## 2021-06-21 ENCOUNTER — Encounter: Payer: Self-pay | Admitting: Family Medicine

## 2021-06-21 ENCOUNTER — Ambulatory Visit (INDEPENDENT_AMBULATORY_CARE_PROVIDER_SITE_OTHER): Payer: No Typology Code available for payment source | Admitting: Family Medicine

## 2021-06-21 ENCOUNTER — Other Ambulatory Visit: Payer: Self-pay

## 2021-06-21 VITALS — BP 112/62 | HR 70 | Temp 98.3°F | Ht 64.0 in | Wt 142.5 lb

## 2021-06-21 DIAGNOSIS — M25552 Pain in left hip: Secondary | ICD-10-CM

## 2021-06-21 NOTE — Progress Notes (Signed)
Musculoskeletal Exam  Patient: Stephanie Prince DOB: 12/24/1980  DOS: 06/21/2021  SUBJECTIVE:  Chief Complaint:   Chief Complaint  Patient presents with   sciactic    Left side    Stephanie Prince is a 40 y.o.  female for evaluation and treatment of sciatic pain.   Onset:  10 days ago. No inj or change in activity.  Location: L outer buttock 4/10 severity.  Radiates down thigh Character:  aching  Progression of issue:  is unchanged Associated symptoms: No bruising, redness, swelling, decreased ROm Treatment: to date has been ice, OTC NSAIDS, home exercises, and heat.   Neurovascular symptoms: no  Past Medical History:  Diagnosis Date   Anemia    History of gestational hypertension    Hx of varicella     Objective: VITAL SIGNS: BP 112/62   Pulse 70   Temp 98.3 F (36.8 C) (Oral)   Ht '5\' 4"'$  (1.626 m)   Wt 142 lb 8 oz (64.6 kg)   SpO2 99%   BMI 24.46 kg/m  Constitutional: Well formed, well developed. No acute distress. Thorax & Lungs: No accessory muscle use Musculoskeletal: Low back/L buttock.   Tenderness to palpation: mild ttp over L glute med Mild ttp/discomfort w resisted L hip abduction Deformity: no Ecchymosis: no Tests positive: none Tests negative: straight leg, Ober's, log roll, FABER, FADDIR Neurologic: Normal sensory function. Gait normal.  Psychiatric: Normal mood. Age appropriate judgment and insight. Alert & oriented x 3.    Assessment:  Left hip pain  Plan: Stretches/exercises for gluteus medius, low back, heat, ice, Tylenol, ibuprofen. Hold stretches for at least 30 seconds. PT if no better.  F/u prn. The patient voiced understanding and agreement to the plan.   [place], DO 06/21/21  4:36 PM

## 2021-06-21 NOTE — Patient Instructions (Signed)
OK to take Tylenol 1000 mg (2 extra strength tabs) or 975 mg (3 regular strength tabs) every 6 hours as needed.  Ibuprofen 400-600 mg (2-3 over the counter strength tabs) every 6 hours as needed for pain.  Ice/cold pack over area for 10-15 min twice daily.  Heat (pad or rice pillow in microwave) over affected area, 10-15 minutes twice daily.   Let us know if you need anything.  Gluteus Medius Syndrome Rehab It is normal to feel mild stretching, pulling, tightness, or discomfort as you do these exercises, but you should stop right away if you feel sudden pain or your pain gets worse.   Stretching and range of motion exercise This exercise warms up your muscles and joints and improves the movement and flexibility of your hip and pelvis. This exercise also helps to relieve pain and stiffness. Exercise A: Lunge (hip flexor stretch)      Kneel on the floor on your left / right knee. Bend your other knee so it is directly over your ankle. Keep good posture with your head over your shoulders. Tuck your tailbone underneath you. This will prevent your back from arching too much. You should feel a gentle stretch in the front of your thigh or hip. If you do not feel a stretch, slowly lunge forward with your chest up. Hold this position for 30 seconds. Slowly return to the starting position. Repeat 2 times. Complete this exercise 3 times per week. Strengthening exercises These exercises build strength and endurance in your hip and pelvis. Endurance is the ability to use your muscles for a long time, even after they get tired. Exercise B: Bridge (hip extensors)     Lie on your back on a firm surface with your knees bent and your feet flat on the floor. Tighten your buttocks muscles and lift your bottom off the floor until the trunk of your body is level with your thighs. You should feel the muscles working in your buttocks and the back of your thighs. If this exercise is too easy, cross your arms  over your chest or lift one leg while your bottom is up off the floor. Do not arch your back. Hold this position for 3 seconds. Slowly lower your hips to the starting position. Let your muscles relax completely between repetitions. Repeat 2 times. Complete this exercise 3 times per week. Exercise C: Straight leg raises (hip abductors)     Lie on your side with your left / right leg in the top position. Lie so your head, shoulder, knee, and hip line up. Bend your bottom knee to help you balance. Lift your top leg up 4-6 inches (10-15 cm), keeping your toes pointed straight ahead. Hold this position for 2 seconds. Slowly lower your leg to the starting position and let your muscles relax completely. Repeat for a total of 10 repetitions. Repeat 2 times. Complete this exercise 3 times per week. Exercise D: Hip abductors and external rotators, quadruped Get on your hands and knees on a firm, lightly padded surface. Your hands should be directly below your shoulders, and your knees should be directly below your hips. Lift your left / right knee out to the side. Keep your knee bent. Do not twist your body. Hold this position for 3 seconds. Slowly lower your leg. Repeat for a total of 10 repetitions.  Repeat 2 times. Complete this exercise 3 times per week. Exercise E: Single leg stand Stand near a counter or door frame to hold  onto as needed. It is helpful to look in a mirror for this exercise so you can watch your hip. Squeeze your left / right buttock muscles then lift up your other foot. Do not let your left / right hip push out to the side. Hold this position for 3 seconds. Repeat for a total of 10 repetitions. Repeat 2 times. Complete this exercise 3 times per week. Make sure you discuss any questions you have with your health care provider. Document Released: 10/06/2005 Document Revised: 06/12/2016 Document Reviewed: 09/18/2015 Elsevier Interactive Patient Education  2018 [place] (ROM) AND STRETCHING EXERCISES - Low Back Pain Most people with lower back pain will find that their symptoms get worse with excessive bending forward (flexion) or arching at the lower back (extension). The exercises that will help resolve your symptoms will focus on the opposite motion.  If you have pain, numbness or tingling which travels down into your buttocks, leg or foot, the goal of the therapy is for these symptoms to move closer to your back and eventually resolve. Sometimes, these leg symptoms will get better, but your lower back pain may worsen. This is often an indication of progress in your rehabilitation. Be very alert to any changes in your symptoms and the activities in which you participated in the 24 hours prior to the change. Sharing this information with your caregiver will allow him or her to most efficiently treat your condition. These exercises may help you when beginning to rehabilitate your injury. Your symptoms may resolve with or without further involvement from your physician, physical therapist or athletic trainer. While completing these exercises, remember:  Restoring tissue flexibility helps normal motion to return to the joints. This allows healthier, less painful movement and activity. An effective stretch should be held for at least 30 seconds. A stretch should never be painful. You should only feel a gentle lengthening or release in the stretched tissue. FLEXION RANGE OF MOTION AND STRETCHING EXERCISES:  STRETCH - Flexion, Single Knee to Chest  Lie on a firm bed or floor with both legs extended in front of you. Keeping one leg in contact with the floor, bring your opposite knee to your chest. Hold your leg in place by either grabbing behind your thigh or at your knee. Pull until you feel a gentle stretch in your low back. Hold 30 seconds. Slowly release your grasp and repeat the exercise with the opposite side. Repeat 2 times.  Complete this exercise 3 times per week.   STRETCH - Flexion, Double Knee to Chest Lie on a firm bed or floor with both legs extended in front of you. Keeping one leg in contact with the floor, bring your opposite knee to your chest. Tense your stomach muscles to support your back and then lift your other knee to your chest. Hold your legs in place by either grabbing behind your thighs or at your knees. Pull both knees toward your chest until you feel a gentle stretch in your low back. Hold 30 seconds. Tense your stomach muscles and slowly return one leg at a time to the floor. Repeat 2 times. Complete this exercise 3 times per week.   STRETCH - Low Trunk Rotation Lie on a firm bed or floor. Keeping your legs in front of you, bend your knees so they are both pointed toward the ceiling and your feet are flat on the floor. Extend your arms out to the side. This will stabilize your  upper body by keeping your shoulders in contact with the floor. Gently and slowly drop both knees together to one side until you feel a gentle stretch in your low back. Hold for 30 seconds. Tense your stomach muscles to support your lower back as you bring your knees back to the starting position. Repeat the exercise to the other side. Repeat 2 times. Complete this exercise at least 3 times per week.   EXTENSION RANGE OF MOTION AND FLEXIBILITY EXERCISES:  STRETCH - Extension, Prone on Elbows  Lie on your stomach on the floor, a bed will be too soft. Place your palms about shoulder width apart and at the height of your head. Place your elbows under your shoulders. If this is too painful, stack pillows under your chest. Allow your body to relax so that your hips drop lower and make contact more completely with the floor. Hold this position for 30 seconds. Slowly return to lying flat on the floor. Repeat 2 times. Complete this exercise 3 times per week.   RANGE OF MOTION - Extension, Prone Press Ups Lie on your  stomach on the floor, a bed will be too soft. Place your palms about shoulder width apart and at the height of your head. Keeping your back as relaxed as possible, slowly straighten your elbows while keeping your hips on the floor. You may adjust the placement of your hands to maximize your comfort. As you gain motion, your hands will come more underneath your shoulders. Hold this position 30 seconds. Slowly return to lying flat on the floor. Repeat 2 times. Complete this exercise 3 times per week.   RANGE OF MOTION- Quadruped, Neutral Spine  Assume a hands and knees position on a firm surface. Keep your hands under your shoulders and your knees under your hips. You may place padding under your knees for comfort. Drop your head and point your tailbone toward the ground below you. This will round out your lower back like an angry cat. Hold this position for 30 seconds. Slowly lift your head and release your tail bone so that your back sags into a large arch, like an old horse. Hold this position for 30 seconds. Repeat this until you feel limber in your low back. Now, find your "sweet spot." This will be the most comfortable position somewhere between the two previous positions. This is your neutral spine. Once you have found this position, tense your stomach muscles to support your low back. Hold this position for 30 seconds. Repeat 2 times. Complete this exercise 3 times per week.   STRENGTHENING EXERCISES - Low Back Sprain These exercises may help you when beginning to rehabilitate your injury. These exercises should be done near your "sweet spot." This is the neutral, low-back arch, somewhere between fully rounded and fully arched, that is your least painful position. When performed in this safe range of motion, these exercises can be used for people who have either a flexion or extension based injury. These exercises may resolve your symptoms with or without further involvement from your  physician, physical therapist or athletic trainer. While completing these exercises, remember:  Muscles can gain both the endurance and the strength needed for everyday activities through controlled exercises. Complete these exercises as instructed by your physician, physical therapist or athletic trainer. Increase the resistance and repetitions only as guided. You may experience muscle soreness or fatigue, but the pain or discomfort you are trying to eliminate should never worsen during these exercises. If this pain  does worsen, stop and make certain you are following the directions exactly. If the pain is still present after adjustments, discontinue the exercise until you can discuss the trouble with your caregiver.  STRENGTHENING - Deep Abdominals, Pelvic Tilt  Lie on a firm bed or floor. Keeping your legs in front of you, bend your knees so they are both pointed toward the ceiling and your feet are flat on the floor. Tense your lower abdominal muscles to press your low back into the floor. This motion will rotate your pelvis so that your tail bone is scooping upwards rather than pointing at your feet or into the floor. With a gentle tension and even breathing, hold this position for 3 seconds. Repeat 2 times. Complete this exercise 3 times per week.   STRENGTHENING - Abdominals, Crunches  Lie on a firm bed or floor. Keeping your legs in front of you, bend your knees so they are both pointed toward the ceiling and your feet are flat on the floor. Cross your arms over your chest. Slightly tip your chin down without bending your neck. Tense your abdominals and slowly lift your trunk high enough to just clear your shoulder blades. Lifting higher can put excessive stress on the lower back and does not further strengthen your abdominal muscles. Control your return to the starting position. Repeat 2 times. Complete this exercise 3 times per week.   STRENGTHENING - Quadruped, Opposite UE/LE Lift   Assume a hands and knees position on a firm surface. Keep your hands under your shoulders and your knees under your hips. You may place padding under your knees for comfort. Find your neutral spine and gently tense your abdominal muscles so that you can maintain this position. Your shoulders and hips should form a rectangle that is parallel with the floor and is not twisted. Keeping your trunk steady, lift your right hand no higher than your shoulder and then your left leg no higher than your hip. Make sure you are not holding your breath. Hold this position for 30 seconds. Continuing to keep your abdominal muscles tense and your back steady, slowly return to your starting position. Repeat with the opposite arm and leg. Repeat 2 times. Complete this exercise 3 times per week.   STRENGTHENING - Abdominals and Quadriceps, Straight Leg Raise  Lie on a firm bed or floor with both legs extended in front of you. Keeping one leg in contact with the floor, bend the other knee so that your foot can rest flat on the floor. Find your neutral spine, and tense your abdominal muscles to maintain your spinal position throughout the exercise. Slowly lift your straight leg off the floor about 6 inches for a count of 3, making sure to not hold your breath. Still keeping your neutral spine, slowly lower your leg all the way to the floor. Repeat this exercise with each leg 2 times. Complete this exercise 3 times per week.  POSTURE AND BODY MECHANICS CONSIDERATIONS - Low Back Sprain Keeping correct posture when sitting, standing or completing your activities will reduce the stress put on different body tissues, allowing injured tissues a chance to heal and limiting painful experiences. The following are general guidelines for improved posture.  While reading these guidelines, remember: The exercises prescribed by your provider will help you have the flexibility and strength to maintain correct postures. The correct  posture provides the best environment for your joints to work. All of your joints have less wear and tear when properly  supported by a spine with good posture. This means you will experience a healthier, less painful body. Correct posture must be practiced with all of your activities, especially prolonged sitting and standing. Correct posture is as important when doing repetitive low-stress activities (typing) as it is when doing a single heavy-load activity (lifting).  RESTING POSITIONS Consider which positions are most painful for you when choosing a resting position. If you have pain with flexion-based activities (sitting, bending, stooping, squatting), choose a position that allows you to rest in a less flexed posture. You would want to avoid curling into a fetal position on your side. If your pain worsens with extension-based activities (prolonged standing, working overhead), avoid resting in an extended position such as sleeping on your stomach. Most people will find more comfort when they rest with their spine in a more neutral position, neither too rounded nor too arched. Lying on a non-sagging bed on your side with a pillow between your knees, or on your back with a pillow under your knees will often provide some relief. Keep in mind, being in any one position for a prolonged period of time, no matter how correct your posture, can still lead to stiffness.  PROPER SITTING POSTURE In order to minimize stress and discomfort on your spine, you must sit with correct posture. Sitting with good posture should be effortless for a healthy body. Returning to good posture is a gradual process. Many people can work toward this most comfortably by using various supports until they have the flexibility and strength to maintain this posture on their own. When sitting with proper posture, your ears will fall over your shoulders and your shoulders will fall over your hips. You should use the back of the chair to  support your upper back. Your lower back will be in a neutral position, just slightly arched. You may place a small pillow or folded towel at the base of your lower back for  support.  When working at a desk, create an environment that supports good, upright posture. Without extra support, muscles tire, which leads to excessive strain on joints and other tissues. Keep these recommendations in mind:  CHAIR: A chair should be able to slide under your desk when your back makes contact with the back of the chair. This allows you to work closely. The chair's height should allow your eyes to be level with the upper part of your monitor and your hands to be slightly lower than your elbows.  BODY POSITION Your feet should make contact with the floor. If this is not possible, use a foot rest. Keep your ears over your shoulders. This will reduce stress on your neck and low back.  INCORRECT SITTING POSTURES  If you are feeling tired and unable to assume a healthy sitting posture, do not slouch or slump. This puts excessive strain on your back tissues, causing more damage and pain. Healthier options include: Using more support, like a lumbar pillow. Switching tasks to something that requires you to be upright or walking. Talking a brief walk. Lying down to rest in a neutral-spine position.  PROLONGED STANDING WHILE SLIGHTLY LEANING FORWARD  When completing a task that requires you to lean forward while standing in one place for a long time, place either foot up on a stationary 2-4 inch high object to help maintain the best posture. When both feet are on the ground, the lower back tends to lose its slight inward curve. If this curve flattens (or becomes too  large), then the back and your other joints will experience too much stress, tire more quickly, and can cause pain.  CORRECT STANDING POSTURES Proper standing posture should be assumed with all daily activities, even if they only take a few moments,  like when brushing your teeth. As in sitting, your ears should fall over your shoulders and your shoulders should fall over your hips. You should keep a slight tension in your abdominal muscles to brace your spine. Your tailbone should point down to the ground, not behind your body, resulting in an over-extended swayback posture.   INCORRECT STANDING POSTURES  Common incorrect standing postures include a forward head, locked knees and/or an excessive swayback. WALKING Walk with an upright posture. Your ears, shoulders and hips should all line-up.  PROLONGED ACTIVITY IN A FLEXED POSITION When completing a task that requires you to bend forward at your waist or lean over a low surface, try to find a way to stabilize 3 out of 4 of your limbs. You can place a hand or elbow on your thigh or rest a knee on the surface you are reaching across. This will provide you more stability, so that your muscles do not tire as quickly. By keeping your knees relaxed, or slightly bent, you will also reduce stress across your lower back. CORRECT LIFTING TECHNIQUES  DO : Assume a wide stance. This will provide you more stability and the opportunity to get as close as possible to the object which you are lifting. Tense your abdominals to brace your spine. Bend at the knees and hips. Keeping your back locked in a neutral-spine position, lift using your leg muscles. Lift with your legs, keeping your back straight. Test the weight of unknown objects before attempting to lift them. Try to keep your elbows locked down at your sides in order get the best strength from your shoulders when carrying an object.   Always ask for help when lifting heavy or awkward objects. INCORRECT LIFTING TECHNIQUES DO NOT:  Lock your knees when lifting, even if it is a small object. Bend and twist. Pivot at your feet or move your feet when needing to change directions. Assume that you can safely pick up even a paperclip without proper  posture.

## 2021-07-18 ENCOUNTER — Other Ambulatory Visit (HOSPITAL_COMMUNITY): Payer: Self-pay

## 2021-07-18 MED ORDER — CARESTART COVID-19 HOME TEST VI KIT
PACK | 0 refills | Status: DC
  Filled 2021-07-18: qty 4, 4d supply, fill #0

## 2021-09-24 ENCOUNTER — Other Ambulatory Visit (HOSPITAL_BASED_OUTPATIENT_CLINIC_OR_DEPARTMENT_OTHER): Payer: Self-pay

## 2021-09-24 MED ORDER — CARESTART COVID-19 HOME TEST VI KIT
PACK | 0 refills | Status: DC
  Filled 2021-09-24: qty 2, 4d supply, fill #0

## 2021-09-25 ENCOUNTER — Ambulatory Visit: Payer: No Typology Code available for payment source | Admitting: Family Medicine

## 2021-10-03 ENCOUNTER — Encounter: Payer: Self-pay | Admitting: Family Medicine

## 2021-10-04 ENCOUNTER — Other Ambulatory Visit (HOSPITAL_BASED_OUTPATIENT_CLINIC_OR_DEPARTMENT_OTHER): Payer: Self-pay

## 2021-10-04 MED ORDER — PANTOPRAZOLE SODIUM 40 MG PO TBEC
40.0000 mg | DELAYED_RELEASE_TABLET | Freq: Every day | ORAL | 3 refills | Status: DC
  Filled 2021-10-04: qty 30, 30d supply, fill #0

## 2021-10-11 ENCOUNTER — Other Ambulatory Visit (HOSPITAL_BASED_OUTPATIENT_CLINIC_OR_DEPARTMENT_OTHER): Payer: Self-pay

## 2021-10-20 NOTE — L&D Delivery Note (Addendum)
[facility] Delivery Note  Stephanie Prince is a 41 y.o. [phone] s/p SVD at [redacted]w[redacted]d She was admitted for IOL for gHTN.   ROM: 1h 329mith clear fluid GBS Status: Positive, treated with PCN Maximum Maternal Temperature: 98.2  Labor Progress: IOL started with oral cytotec, then appropriate for Pitocin titration. AROM performed after pt got her epidural and she progressed quickly to complete.  Delivery Date/Time: [place]22/[place] Early decels noted on tracing, then a [id number]. Pt on her right side which helped FHR recover and pt found to be complete and +2. Pushed well and head delivered LOA. No nuchal cord present. Shoulder and body delivered in usual fashion. Infant with spontaneous cry, placed on mother's abdomen, dried and stimulated. Some brisk dark red bleeding noted so TXA given and Pitocin infusion started. Cord clamped x 2 after 1-minute delay, and cut by FOB. Cord blood drawn and cord sample collected. Placenta delivered spontaneously, intact, with 3-vessel cord. Fundus firm with massage and Pitocin. Labia, perineum, vagina, and cervix inspected, no laceration found.   Placenta: Spontaneous, intact, sent to L&D for disposal. Complications: None Lacerations: None EBL: 200 Analgesia: Epidural  Postpartum Planning '[x]'$  transfer orders to MB '[x]'$  discharge summary started & shared '[x]'$  message to sent to schedule follow-up  '[x]'$  lists updated '[x]'$  vaccines UTD  Infant: Boy(no)  APGARs 9/9  321GuayamaNMNemacolinBCFiler Cityrtified Nurse Midwife, FacMilan General Hospitalr WomDean Foods CompanyonHolden3/2023, 8:03 PM

## 2022-01-09 ENCOUNTER — Encounter: Payer: Self-pay | Admitting: *Deleted

## 2022-01-13 ENCOUNTER — Encounter: Payer: Self-pay | Admitting: *Deleted

## 2022-01-13 DIAGNOSIS — O09529 Supervision of elderly multigravida, unspecified trimester: Secondary | ICD-10-CM | POA: Insufficient documentation

## 2022-01-13 DIAGNOSIS — O0990 Supervision of high risk pregnancy, unspecified, unspecified trimester: Secondary | ICD-10-CM | POA: Insufficient documentation

## 2022-01-14 ENCOUNTER — Ambulatory Visit (INDEPENDENT_AMBULATORY_CARE_PROVIDER_SITE_OTHER): Payer: No Typology Code available for payment source | Admitting: Obstetrics and Gynecology

## 2022-01-14 ENCOUNTER — Other Ambulatory Visit (HOSPITAL_COMMUNITY)
Admission: RE | Admit: 2022-01-14 | Discharge: 2022-01-14 | Disposition: A | Discharge status: Home / Self Care | Payer: No Typology Code available for payment source | Source: Ambulatory Visit | Attending: Obstetrics and Gynecology | Admitting: Obstetrics and Gynecology

## 2022-01-14 ENCOUNTER — Other Ambulatory Visit: Payer: Self-pay

## 2022-01-14 ENCOUNTER — Encounter: Payer: Self-pay | Admitting: Obstetrics and Gynecology

## 2022-01-14 VITALS — BP 135/80 | HR 84 | Wt 149.0 lb

## 2022-01-14 DIAGNOSIS — O0990 Supervision of high risk pregnancy, unspecified, unspecified trimester: Secondary | ICD-10-CM | POA: Insufficient documentation

## 2022-01-14 DIAGNOSIS — Z8759 Personal history of other complications of pregnancy, childbirth and the puerperium: Secondary | ICD-10-CM

## 2022-01-14 DIAGNOSIS — D1721 Benign lipomatous neoplasm of skin and subcutaneous tissue of right arm: Secondary | ICD-10-CM

## 2022-01-14 NOTE — Progress Notes (Signed)
? ?History:  ? Stephanie Prince is a 41 y.o. [phone] at 31w5dby LMP, early ultrasound being seen today for her first obstetrical visit.  Her obstetrical history is significant for pregnancy induced hypertension, preterm delivery  and pre-eclampsia. Patient does intend to breast feed. Pregnancy history fully reviewed. ? ?3 girls at home. ?It's a BOY ?[facility]Reports history of right axilla lipoma; reports increase in tenderness in the axilla. Had UKoreadone last year and it was normal.  ? ?Patient reports no complaints. ? ? ?HISTORY: ?OB History  ?Gravida Para Term Preterm AB Living  ?'5 3 2 1 1 3  '$ ?SAB IAB Ectopic Multiple Live Births  ?1 0 0 0 3  ?  ?# Outcome Date GA Lbr Len/2nd Weight Sex Delivery Anes PTL Lv  ?5 Current           ?4 Term 03/01/19 364w0d2:58 / 00:07 7 lb 9.2 oz (3.436 kg) F Vag-Spont EPI  LIV  ?   Name: KOANDREIA, GANDOLFI?   Apgar1: 8  Apgar5: 9  ?3 Term 11/14/16 3880w3d:37 / 00:10 6 lb 3 oz (2.807 kg) F Vag-Spont EPI  LIV  ?   Birth Comments: PIH  ?   Name: KOOYESENA, REAVES   Apgar1: 8  Apgar5: 9  ?2 Preterm 08/15/15 35w79w5d15 / 01:22 4 lb 15.4 oz (2.25 kg) F Vag-Spont EPI  LIV  ?   Birth Comments: pre eclampsia  ?   Name: KOONDHAMAR, GREGORY  Apgar1: 8  Apgar5: 8  ?1 SAB 07/2014 6w0d17w0d    ?  ?Last pap smear was done 2020- normal. Records requested  ? ?Past Medical History:  ?Diagnosis Date  ? Anemia   ? History of gestational hypertension   ? Hx of varicella   ? ?Past Surgical History:  ?Procedure Laterality Date  ? APPENDECTOMY    ? WISDOM TOOTH EXTRACTION    ? ?Family History  ?Problem Relation Age of Onset  ? Hypertension Mother   ? Other Father   ?     brain tumor  ? Cancer Father   ? Hypertension Maternal Grandmother   ? Heart failure Maternal Grandmother   ? Alcohol abuse Maternal Grandfather   ? Cancer Paternal Grandfather   ? Breast cancer Maternal Aunt   ? Cancer Maternal Uncle   ? Cancer Maternal Aunt   ? ?Social History  ? ?Tobacco Use  ?  Smoking status: Never  ? Smokeless tobacco: Never  ?Vaping Use  ? Vaping Use: Never used  ?Substance Use Topics  ? Alcohol use: No  ? Drug use: No  ? ?No Known Allergies ?Current Outpatient Medications on File Prior to Visit  ?Medication Sig Dispense Refill  ? aspirin EC 81 MG tablet Take 162 mg by mouth daily. Swallow whole.    ? Multiple Vitamin (MULTIVITAMIN) tablet Take 1 tablet by mouth daily.    ? ondansetron (ZOFRAN ODT) 4 MG disintegrating tablet Take 1 tablet (4 mg total) by mouth every 8 (eight) hours as needed. (Patient not taking: Reported on 01/14/2022) 10 tablet 0  ? ?No current facility-administered medications on file prior to visit.  ? ? ?Review of Systems ?Pertinent items noted in HPI and remainder of comprehensive ROS otherwise negative. ? ?Indications for ASA therapy (per UpToDate) ?One of the following: ?Previous pregnancy with preeclampsia, especially early onset and with an adverse outcome Yes ?Multifetal gestation No ?Chronic  hypertension No ?Type 1 or 2 diabetes mellitus No ?Chronic kidney disease No ?Autoimmune disease (antiphospholipid syndrome, systemic lupus erythematosus) No ?Two or more of the following: ?Nulliparity No ?Obesity (body mass index >30 kg/m2) No ?Family history of preeclampsia in mother or sister No ?Age ?35 years Yes ?Sociodemographic characteristics (African American race, low socioeconomic level) No ?Personal risk factors (eg, previous pregnancy with low birth weight or small for gestational age infant, previous adverse pregnancy outcome [eg, stillbirth], interval >10 years between pregnancies) No ? ?Physical Exam:  ? ?Vitals:  ? 01/14/22 1438  ?BP: 135/80  ?Pulse: 84  ?Weight: 149 lb (67.6 kg)  ? ?BP 135/80   Pulse 84   Wt 149 lb (67.6 kg)   LMP 10/03/2021   BMI 25.58 kg/m?  ? ?BP 135/80   Pulse 84   Wt 149 lb (67.6 kg)   LMP 10/03/2021   BMI 25.58 kg/m?  ?Uterine Size: size equals dates  ?Pelvic Exam:   ? Perineum: No Hemorrhoids, Normal Perineum  ? Vulva:  normal  ? Vagina:  normal mucosa, normal discharge, no palpable nodules  ? pH: Not done  ? Cervix: no bleeding following Pap, no cervical motion tenderness and no lesions  ? Adnexa: normal adnexa and no mass, fullness, tenderness  ? Bony Pelvis: Adequate  ?System: Breast:  No nipple retraction or dimpling, No nipple discharge or bleeding, No axillary or supraclavicular adenopathy, Normal to palpation without dominant masses, tenderness and firm nodules in right axilla.  No erythema.   ? Skin: normal coloration and turgor, no rashes ?  ? Neurologic: negative  ? Extremities: normal strength, tone, and muscle mass  ? HEENT neck supple with midline trachea and thyroid without masses  ? Mouth/Teeth mucous membranes moist, pharynx normal without lesions  ? Neck supple and no masses  ? Cardiovascular: regular rate and rhythm, no murmurs or gallops  ? Respiratory:  appears well, vitals normal, no respiratory distress, acyanotic, normal RR, neck free of mass or lymphadenopathy, chest clear, no wheezing, crepitations, rhonchi, normal symmetric air entry  ? Abdomen: soft, non-tender; bowel sounds normal; no masses,  no organomegaly  ? Urinary: urethral meatus normal  ?  ?Assessment:  ? ?Pregnancy: [id number] ?Patient Active Problem List  ? Diagnosis Date Noted  ? Lipoma of right axilla 01/14/2022  ? Supervision of high risk pregnancy, antepartum 01/13/2022  ? Maternal age 32+, multigravida, antepartum 01/13/2022  ? IUGR (intrauterine growth restriction) affecting care of mother, third trimester, fetus 1 11/14/2016  ? History of severe pre-eclampsia 08/14/2015  ? ? ?Plan:  ? ? ?1. Supervision of high risk pregnancy, antepartum ? ?- Enroll Patient in PreNatal 59 ?- Cytology - PAP( [place]) ?- Culture, OB Urine ?- Obstetric panel ?- HIV antibody (with reflex) ?- Hepatitis C Antibody ?- [place] MFM OB DETAIL +14 WK; Future ?- Protein / creatinine ratio, urine ? ?2. Lipoma of right axilla ? ?- US BREAST LTD UNI RIGHT INC  AXILLA; Future ? ?3. History of severe pre-eclampsia ? ?-BASA daily ?- Baseline PRE E labs ?- Referral to MFM ? ?Initial labs drawn. ?Continue prenatal vitamins. ?Problem list reviewed and updated. ?Genetic Screening discussed, NIPS: declined. ?Ultrasound discussed; fetal anatomic survey: ordered. ?Anticipatory guidance about prenatal visits given including labs, ultrasounds, and testing. ?Discussed usage of the Babyscripts app for more information about pregnancy, and to track blood pressures. ?Also discussed usage of virtual visits as additional source of managing and completing prenatal visits.   ?Patient was encouraged to use MyChart to  review results, send requests, and have questions addressed.   ?The nature of [facility] Healthcare/Faculty Practice with multiple MDs and Advanced Practice Providers was explained to patient; also emphasized that residents, students are part of our team. ?Routine obstetric precautions reviewed. Encouraged to seek out care at office or emergency room [facility] MAU preferred) for urgent and/or emergent concerns. ?Return 4 weeks, app ok.  ?  ? ?Delane Wessinger, Artist Pais, NP ?Faculty Practice ?[facility] [place] ?  ?

## 2022-01-15 LAB — OBSTETRIC PANEL
Absolute Monocytes: 692 cells/uL (ref 200–950)
Antibody Screen: NOT DETECTED
Basophils Absolute: 55 cells/uL (ref 0–200)
Basophils Relative: 0.6 %
Eosinophils Absolute: 127 cells/uL (ref 15–500)
Eosinophils Relative: 1.4 %
HCT: 35.5 % (ref 35.0–45.0)
Hemoglobin: 12.1 g/dL (ref 11.7–15.5)
Hepatitis B Surface Ag: NONREACTIVE
Lymphs Abs: 1401 cells/uL (ref 850–3900)
MCH: 29.7 pg (ref 27.0–33.0)
MCHC: 34.1 g/dL (ref 32.0–36.0)
MCV: 87 fL (ref 80.0–100.0)
MPV: 11.9 fL (ref 7.5–12.5)
Monocytes Relative: 7.6 %
Neutro Abs: 6825 cells/uL (ref 1500–7800)
Neutrophils Relative %: 75 %
Platelets: 259 10*3/uL (ref 140–400)
RBC: 4.08 10*6/uL (ref 3.80–5.10)
RDW: 13 % (ref 11.0–15.0)
RPR Ser Ql: NONREACTIVE
Rubella: 7.88 Index
Total Lymphocyte: 15.4 %
WBC: 9.1 10*3/uL (ref 3.8–10.8)

## 2022-01-15 LAB — PROTEIN / CREATININE RATIO, URINE
Creatinine, Urine: 82 mg/dL (ref 20–275)
Protein/Creat Ratio: 110 mg/g creat (ref 24–184)
Protein/Creatinine Ratio: 0.11 mg/mg creat (ref 0.024–0.184)
Total Protein, Urine: 9 mg/dL (ref 5–24)

## 2022-01-15 LAB — HEPATITIS C ANTIBODY
Hepatitis C Ab: NONREACTIVE
SIGNAL TO CUT-OFF: 0.17 (ref <1.00)

## 2022-01-15 LAB — HIV ANTIBODY (ROUTINE TESTING W REFLEX): HIV 1&2 Ab, 4th Generation: NONREACTIVE

## 2022-01-16 LAB — CULTURE, OB URINE

## 2022-01-16 LAB — URINE CULTURE, OB REFLEX

## 2022-01-17 LAB — CYTOLOGY - PAP
Chlamydia: NEGATIVE
Comment: NEGATIVE
Comment: NEGATIVE
Comment: NORMAL
Diagnosis: NEGATIVE
High risk HPV: NEGATIVE
Neisseria Gonorrhea: NEGATIVE

## 2022-01-24 ENCOUNTER — Ambulatory Visit
Admission: RE | Admit: 2022-01-24 | Discharge: 2022-01-24 | Disposition: A | Discharge status: Home / Self Care | Payer: No Typology Code available for payment source | Source: Ambulatory Visit | Attending: Obstetrics and Gynecology | Admitting: Obstetrics and Gynecology

## 2022-01-24 ENCOUNTER — Other Ambulatory Visit: Payer: Self-pay | Admitting: Obstetrics and Gynecology

## 2022-01-24 DIAGNOSIS — D1721 Benign lipomatous neoplasm of skin and subcutaneous tissue of right arm: Secondary | ICD-10-CM

## 2022-02-11 ENCOUNTER — Ambulatory Visit (INDEPENDENT_AMBULATORY_CARE_PROVIDER_SITE_OTHER): Payer: No Typology Code available for payment source | Admitting: Obstetrics and Gynecology

## 2022-02-11 VITALS — BP 128/78 | HR 71 | Wt 156.0 lb

## 2022-02-11 DIAGNOSIS — Z8759 Personal history of other complications of pregnancy, childbirth and the puerperium: Secondary | ICD-10-CM

## 2022-02-11 DIAGNOSIS — O0990 Supervision of high risk pregnancy, unspecified, unspecified trimester: Secondary | ICD-10-CM

## 2022-02-11 NOTE — Progress Notes (Signed)
? ?  PRENATAL VISIT NOTE ? ?Subjective:  ?Stephanie Prince is a 41 y.o. [phone] at 27w5dbeing seen today for ongoing prenatal care.  She is currently monitored for the following issues for this high-risk pregnancy and has History of severe pre-eclampsia; IUGR (intrauterine growth restriction) affecting care of mother, third trimester, fetus 1; Supervision of high risk pregnancy, antepartum; Maternal age 39+ multigravida, antepartum; and Lipoma of right axilla on their problem list. ? ?Patient reports  sciatica nerve pain  .  Contractions: Not present. Vag. Bleeding: None.  Movement: Absent. Denies leaking of fluid.  ? ?The following portions of the patient's history were reviewed and updated as appropriate: allergies, current medications, past family history, past medical history, past social history, past surgical history and problem list.  ? ?Objective:  ? ?Vitals:  ? 02/11/22 1340  ?BP: 128/78  ?Pulse: 71  ?Weight: 156 lb (70.8 kg)  ? ? ?Fetal Status: Fetal Heart Rate (bpm): 135 Fundal Height: 19 cm Movement: Absent    ? ?General:  Alert, oriented and cooperative. Patient is in no acute distress.  ?Skin: Skin is warm and dry. No rash noted.   ?Cardiovascular: Normal heart rate noted  ?Respiratory: Normal respiratory effort, no problems with respiration noted  ?Abdomen: Soft, gravid, appropriate for gestational age.  Pain/Pressure: Absent     ?Pelvic: Cervical exam deferred        ?Extremities: Normal range of motion.  Edema: None  ?Mental Status: Normal mood and affect. Normal behavior. Normal judgment and thought content.  ? ?Assessment and Plan:  ?Pregnancy: [id number] 157w5d ?1. Supervision of high risk pregnancy, antepartum ? ?Doing well ?Feeling well ?MFM [place]oreas scheduled.  ?She is having some right sciatic pain. Discussed chiropractic care and tens unit.  ? ?2. History of severe pre-eclampsia ? ?Continue BASA ?BP good! ?Baseline labs have been done.  ? ? ? ?Preterm labor symptoms and general obstetric  precautions including but not limited to vaginal bleeding, contractions, leaking of fluid and fetal movement were reviewed in detail with the patient. ?Please refer to After Visit Summary for other counseling recommendations.  ? ?No follow-ups on file. ? ?Future Appointments  ?Date Time Provider [place]?02/13/2022 10:30 AM WMC-MFC NURSE [facility]-MFC WMC  ?02/13/2022 10:45 AM WMC-MFC US5 WMC-MFCUS WMC  ?03/11/2022  1:10 PM Leory Allinson, JeArtist PaisNP [facility]-WKVA [facility]  ?04/30/2022  8:30 AM [place], NiCrosby OysterDO [facility]-SW PEC  ? ? ?JeNoni SaupeNP  ?

## 2022-02-13 ENCOUNTER — Encounter: Payer: Self-pay | Admitting: *Deleted

## 2022-02-13 ENCOUNTER — Ambulatory Visit: Payer: No Typology Code available for payment source | Attending: Obstetrics and Gynecology

## 2022-02-13 ENCOUNTER — Ambulatory Visit: Payer: No Typology Code available for payment source | Admitting: *Deleted

## 2022-02-13 ENCOUNTER — Other Ambulatory Visit: Payer: Self-pay | Admitting: *Deleted

## 2022-02-13 VITALS — BP 121/63 | HR 69

## 2022-02-13 DIAGNOSIS — O0990 Supervision of high risk pregnancy, unspecified, unspecified trimester: Secondary | ICD-10-CM | POA: Insufficient documentation

## 2022-02-13 DIAGNOSIS — O09892 Supervision of other high risk pregnancies, second trimester: Secondary | ICD-10-CM

## 2022-02-13 DIAGNOSIS — O09292 Supervision of pregnancy with other poor reproductive or obstetric history, second trimester: Secondary | ICD-10-CM

## 2022-02-13 DIAGNOSIS — O09522 Supervision of elderly multigravida, second trimester: Secondary | ICD-10-CM

## 2022-03-11 ENCOUNTER — Telehealth (INDEPENDENT_AMBULATORY_CARE_PROVIDER_SITE_OTHER): Payer: No Typology Code available for payment source | Admitting: Obstetrics and Gynecology

## 2022-03-11 DIAGNOSIS — Z3A22 22 weeks gestation of pregnancy: Secondary | ICD-10-CM

## 2022-03-11 DIAGNOSIS — G5700 Lesion of sciatic nerve, unspecified lower limb: Secondary | ICD-10-CM

## 2022-03-11 DIAGNOSIS — O0992 Supervision of high risk pregnancy, unspecified, second trimester: Secondary | ICD-10-CM

## 2022-03-11 DIAGNOSIS — O0990 Supervision of high risk pregnancy, unspecified, unspecified trimester: Secondary | ICD-10-CM

## 2022-03-11 MED ORDER — BREAST PUMP MISC: 1.0000 | Freq: Every day | 0 refills | Status: DC

## 2022-03-11 NOTE — Progress Notes (Signed)
TELEHEALTH OBSTETRICS VISIT ENCOUNTER NOTE: video visit   Provider location: [facility] [place]   Patient location: Home, provider in the office.   I connected with Stephanie Prince on 03/11/22 at  1:10 PM EDT by telephone at home and verified that I am speaking with the correct person using two identifiers.    I discussed the limitations, risks, security and privacy concerns of performing an evaluation and management service by telephone and the availability of in person appointments. I also discussed with the patient that there may be a patient responsible charge related to this service. The patient expressed understanding and agreed to proceed.  Subjective:  Stephanie Prince is a 41 y.o. [phone] at 35w5dbeing followed for ongoing prenatal care.  She is currently monitored for the following issues for this low-risk pregnancy and has History of severe pre-eclampsia; IUGR (intrauterine growth restriction) affecting care of mother, third trimester, fetus 1; Supervision of high risk pregnancy, antepartum; Maternal age 39+ multigravida, antepartum; and Lipoma of right axilla on their problem list.  Patient reports  continued right sciatic nerve pain . Reports fetal movement. Denies any contractions, bleeding or leaking of fluid.   The following portions of the patient's history were reviewed and updated as appropriate: allergies, current medications, past family history, past medical history, past social history, past surgical history and problem list.   Objective:  Last menstrual period 10/03/2021, unknown if currently breastfeeding. General:  Alert, oriented and cooperative.   Mental Status: Normal mood and affect perceived. Normal judgment and thought content.  Rest of physical exam deferred due to type of encounter  Assessment and Plan:  Pregnancy: [id number] 234w5d 1. Supervision of high risk pregnancy, antepartum  -BP looks great today -Continue BASA   -2 hour GTT next visit in 4 weeks. She requests Jelly Bean's.   Two Hour Glucose Test/Screen for Gestational Diabetes  You may replace the glucose drink with Brach's Classic Jellybeans.  Two Hour Glucose test = 28 Brach's Jellybeans  Patient must bring Brach's jellybeans to the office and eat them in the presence of the lab/office staff within 10 minutes  Your labs will be drawn fasting (before you eat the jellybeans) AND at 1 hour AND at 2 hours after the jellybeans.  You will remain in the office for the whole 2 hour test.  2. Irritation of sciatic nerve, unspecified laterality  -Continue tennis ball and Ten's unit -Discussion of injection if needed   Preterm labor symptoms and general obstetric precautions including but not limited to vaginal bleeding, contractions, leaking of fluid and fetal movement were reviewed in detail with the patient.  I discussed the assessment and treatment plan with the patient. The patient was provided an opportunity to ask questions and all were answered. The patient agreed with the plan and demonstrated an understanding of the instructions. The patient was advised to call back or seek an in-person office evaluation/[facility] any urgent or concerning symptoms. Please refer to After Visit Summary for other counseling recommendations.   I provided 10 minutes of non-face-to-face time during this encounter.  Return in about 4 weeks (around 04/08/2022), or For 2 hour GTT test..  Future Appointments  Date Time Provider [place]7/09/2022  8:30 AM WeShelda PalDO LBPC-SW PE[facility]7/20/2023  9:15 AM [facility]MFC NURSE [facility]/20/2023  9:30 AM WMC-MFC US3 WMC-MFCUS WMChelanNP  [facility] [place]

## 2022-03-12 ENCOUNTER — Other Ambulatory Visit: Payer: Self-pay | Admitting: Obstetrics and Gynecology

## 2022-03-12 MED ORDER — BREAST PUMP MISC: 1.0000 | Freq: Every day | 0 refills | Status: DC

## 2022-03-28 ENCOUNTER — Other Ambulatory Visit (HOSPITAL_BASED_OUTPATIENT_CLINIC_OR_DEPARTMENT_OTHER): Payer: Self-pay

## 2022-03-28 ENCOUNTER — Other Ambulatory Visit: Payer: Self-pay | Admitting: Obstetrics and Gynecology

## 2022-03-28 MED ORDER — POLYMYXIN B-TRIMETHOPRIM 10000-0.1 UNIT/ML-% OP SOLN
1.0000 [drp] | OPHTHALMIC | 1 refills | Status: AC
  Filled 2022-03-28: qty 10, 25d supply, fill #0
  Filled 2022-03-28: qty 10, 7d supply, fill #0

## 2022-04-08 ENCOUNTER — Ambulatory Visit (INDEPENDENT_AMBULATORY_CARE_PROVIDER_SITE_OTHER): Payer: No Typology Code available for payment source

## 2022-04-08 VITALS — BP 120/79 | HR 76 | Wt 160.0 lb

## 2022-04-08 DIAGNOSIS — O0990 Supervision of high risk pregnancy, unspecified, unspecified trimester: Secondary | ICD-10-CM

## 2022-04-08 DIAGNOSIS — Z3A26 26 weeks gestation of pregnancy: Secondary | ICD-10-CM

## 2022-04-08 DIAGNOSIS — O0992 Supervision of high risk pregnancy, unspecified, second trimester: Secondary | ICD-10-CM

## 2022-04-08 NOTE — Progress Notes (Signed)
ROB 26.5 wks GTT, CBC, HIV, RPR today TDAP offered and declined Depression and anxiety screen negative

## 2022-04-08 NOTE — Progress Notes (Signed)
   PRENATAL VISIT NOTE  Subjective:  Stephanie Prince is a 41 y.o. [phone] at 80w5dbeing seen today for ongoing prenatal care.  She is currently monitored for the following issues for this low-risk pregnancy and has History of severe pre-eclampsia; IUGR (intrauterine growth restriction) affecting care of mother, third trimester, fetus 1; Supervision of high risk pregnancy, antepartum; Maternal age 39+ multigravida, antepartum; and Lipoma of right axilla on their problem list.  Patient reports no complaints.  Contractions: Not present. Vag. Bleeding: None.  Movement: Present. Denies leaking of fluid.   The following portions of the patient's history were reviewed and updated as appropriate: allergies, current medications, past family history, past medical history, past social history, past surgical history and problem list.   Objective:   Vitals:   04/08/22 0821  BP: 120/79  Pulse: 76  Weight: 160 lb (72.6 kg)    Fetal Status: Fetal Heart Rate (bpm): 143 Fundal Height: 26 cm Movement: Present     General:  Alert, oriented and cooperative. Patient is in no acute distress.  Skin: Skin is warm and dry. No rash noted.   Cardiovascular: Normal heart rate noted  Respiratory: Normal respiratory effort, no problems with respiration noted  Abdomen: Soft, gravid, appropriate for gestational age.  Pain/Pressure: Absent     Pelvic: Cervical exam deferred        Extremities: Normal range of motion.  Edema: None  Mental Status: Normal mood and affect. Normal behavior. Normal judgment and thought content.   Assessment and Plan:  Pregnancy: [id number] 266w5d. Supervision of high risk pregnancy, antepartum - Routine OB, doing well. No concerns - GTT and labs today - Continue bASA  - 2Hr GTT w/ 1 Hr Carpenter 75 g - CBC - HIV antibody (with reflex) - RPR  2. [redacted] weeks gestation of pregnancy - Endorses active fetal movement - FH appropriate   Preterm labor symptoms and general obstetric  precautions including but not limited to vaginal bleeding, contractions, leaking of fluid and fetal movement were reviewed in detail with the patient. Please refer to After Visit Summary for other counseling recommendations.   Return in about 3 weeks (around 04/29/2022) for LOB.  Future Appointments  Date Time Provider DeLy[place]7/08/2022  9:50 AM SiRenee HarderCNM [facility]/09/2022  8:30 AM WeShelda PalDO LB[facility]-SW PEC  05/08/2022  9:15 AM [facility]MFC NURSE [facility]/20/2023  9:30 AM WMC-MFC US3 WMC-MFCUS WMSorrel  DaRenee HarderCNM

## 2022-04-09 LAB — CBC
HCT: 35.8 % (ref 35.0–45.0)
Hemoglobin: 12.1 g/dL (ref 11.7–15.5)
MCH: 30.2 pg (ref 27.0–33.0)
MCHC: 33.8 g/dL (ref 32.0–36.0)
MCV: 89.3 fL (ref 80.0–100.0)
MPV: 11.8 fL (ref 7.5–12.5)
Platelets: 241 10*3/uL (ref 140–400)
RBC: 4.01 10*6/uL (ref 3.80–5.10)
RDW: 12.2 % (ref 11.0–15.0)
WBC: 8.9 10*3/uL (ref 3.8–10.8)

## 2022-04-09 LAB — RPR: RPR Ser Ql: NONREACTIVE

## 2022-04-09 LAB — 2HR GTT W 1 HR, CARPENTER, 75 G
Glucose, 1 Hr, Gest: 110 mg/dL (ref 65–179)
Glucose, 2 Hr, Gest: 92 mg/dL (ref 65–152)
Glucose, Fasting, Gest: 77 mg/dL (ref 65–91)

## 2022-04-09 LAB — HIV ANTIBODY (ROUTINE TESTING W REFLEX): HIV 1&2 Ab, 4th Generation: NONREACTIVE

## 2022-04-29 ENCOUNTER — Telehealth (INDEPENDENT_AMBULATORY_CARE_PROVIDER_SITE_OTHER): Payer: No Typology Code available for payment source

## 2022-04-29 VITALS — BP 108/72 | Wt 161.0 lb

## 2022-04-29 DIAGNOSIS — O2200 Varicose veins of lower extremity in pregnancy, unspecified trimester: Secondary | ICD-10-CM

## 2022-04-29 DIAGNOSIS — O0990 Supervision of high risk pregnancy, unspecified, unspecified trimester: Secondary | ICD-10-CM

## 2022-04-29 DIAGNOSIS — Z3A29 29 weeks gestation of pregnancy: Secondary | ICD-10-CM

## 2022-04-29 NOTE — Progress Notes (Signed)
   OBSTETRICS PRENATAL VIRTUAL VISIT ENCOUNTER NOTE  Provider location: [facility] [place]   Patient location: Home  I connected with Stephanie Prince on 04/29/22 at  9:50 AM EDT by MyChart Video Encounter and verified that I am speaking with the correct person using two identifiers. I discussed the limitations, risks, security and privacy concerns of performing an evaluation and management service virtually and the availability of in person appointments. I also discussed with the patient that there may be a patient responsible charge related to this service. The patient expressed understanding and agreed to proceed. Subjective:  Stephanie Prince is a 13 y.o. [phone] at 45w5dbeing seen today for ongoing prenatal care.  She is currently monitored for the following issues for this low-risk pregnancy and has History of severe pre-eclampsia; IUGR (intrauterine growth restriction) affecting care of mother, third trimester, fetus 1; Supervision of high risk pregnancy, antepartum; Maternal age 39+ multigravida, antepartum; and Lipoma of right axilla on their problem list.  Patient reports no complaints.  Contractions: Not present. Vag. Bleeding: None.  Movement: Present. Denies any leaking of fluid.   The following portions of the patient's history were reviewed and updated as appropriate: allergies, current medications, past family history, past medical history, past social history, past surgical history and problem list.   Objective:   Vitals:   04/29/22 0943  BP: 108/72  Weight: 161 lb (73 kg)    Fetal Status:     Movement: Present     General:  Alert, oriented and cooperative. Patient is in no acute distress.  Respiratory: Normal respiratory effort, no problems with respiration noted  Mental Status: Normal mood and affect. Normal behavior. Normal judgment and thought content.  Rest of physical exam deferred due to type of encounter  Imaging: No results  found.  Assessment and Plan:  Pregnancy: [id number] 273w5d. Supervision of high risk pregnancy, antepartum - Routine OB. Doing well - Anticipatory guidance for upcoming appointments provided - F/u growth scan on 7/20  2. [redacted] weeks gestation of pregnancy - Endorses active fetal movement  3. Varicose veins during pregnancy - Recommend compression socks, elevate legs   Preterm labor symptoms and general obstetric precautions including but not limited to vaginal bleeding, contractions, leaking of fluid and fetal movement were reviewed in detail with the patient. I discussed the assessment and treatment plan with the patient. The patient was provided an opportunity to ask questions and all were answered. The patient agreed with the plan and demonstrated an understanding of the instructions. The patient was advised to call back or seek an in-person office evaluation/[facility] any urgent or concerning symptoms. Please refer to After Visit Summary for other counseling recommendations.   I provided 12 minutes of face-to-face time during this encounter.  Return in about 2 weeks (around 05/13/2022).  Future Appointments  Date Time Provider DeRu[place]7/09/2022  8:30 AM WeShelda PalDO [facility]/20/2023  9:15 AM WMC-MFC NURSE [facility]7/20/2023  9:30 AM WMC-MFC US3 WMC-MFCUS WMOkabenaCNCurrieor WoDean Foods CompanyCoRoyal Palm Beach

## 2022-04-30 ENCOUNTER — Ambulatory Visit (INDEPENDENT_AMBULATORY_CARE_PROVIDER_SITE_OTHER): Payer: No Typology Code available for payment source | Admitting: Family Medicine

## 2022-04-30 ENCOUNTER — Encounter: Payer: Self-pay | Admitting: Family Medicine

## 2022-04-30 VITALS — BP 102/68 | HR 69 | Temp 98.3°F | Ht 64.0 in | Wt 162.0 lb

## 2022-04-30 DIAGNOSIS — Z0000 Encounter for general adult medical examination without abnormal findings: Secondary | ICD-10-CM

## 2022-04-30 NOTE — Patient Instructions (Signed)
Keep the diet clean and stay active.  Congratulations on your new addition.   Let us know if you need anything.

## 2022-04-30 NOTE — Progress Notes (Signed)
Chief Complaint  Patient presents with   Annual Exam     Well Woman Stephanie Prince is here for a complete physical.   Her last physical was >1 year ago.  Current diet: in general, a "healthy" diet. Current exercise: walking. Contraception? No, pregnant Fatigue out of ordinary? Sometimes but is pregnant Seatbelt? Yes Advanced directive? Yes  Health Maintenance Pap/HPV- Yes Tetanus- Yes HIV screening- Yes Hep C screening- Yes  Past Medical History:  Diagnosis Date   Anemia    History of gestational hypertension    Hx of varicella      Past Surgical History:  Procedure Laterality Date   APPENDECTOMY     WISDOM TOOTH EXTRACTION      Medications  Current Outpatient Medications on File Prior to Visit  Medication Sig Dispense Refill   aspirin EC 81 MG tablet Take 162 mg by mouth daily. Swallow whole.     Allergies No Known Allergies  Review of Systems: Constitutional:  no unexpected weight changes Eye:  no recent significant change in vision Ear/Nose/Mouth/Throat:  Ears:  no tinnitus or vertigo and no recent change in hearing Nose/Mouth/Throat:  no complaints of nasal congestion, no sore throat Cardiovascular: no chest pain Respiratory:  no cough and no shortness of breath Gastrointestinal:  no abdominal pain, no change in bowel habits GU:  Female: negative for dysuria or pelvic pain Musculoskeletal/Extremities:  no pain of the joints Integumentary (Skin/Breast):  no abnormal skin lesions reported Neurologic:  no headaches Endocrine:  denies fatigue Hematologic/Lymphatic:  No areas of easy bleeding  Exam BP 102/68   Pulse 69   Temp 98.3 F (36.8 C) (Oral)   Ht '5\' 4"'$  (1.626 m)   Wt 162 lb (73.5 kg)   LMP 10/03/2021   SpO2 97%   BMI 27.81 kg/m  General:  well developed, well nourished, in no apparent distress Skin:  no significant moles, warts, or growths Head:  no masses, lesions, or tenderness Eyes:  pupils equal and round, sclera anicteric without  injection Ears:  canals without lesions, TMs shiny without retraction, no obvious effusion, no erythema Nose:  nares patent, septum midline, mucosa normal, and no drainage or sinus tenderness Throat/Pharynx:  lips and gingiva without lesion; tongue and uvula midline; non-inflamed pharynx; no exudates or postnasal drainage Neck: neck supple without adenopathy, thyromegaly, or masses Lungs:  clear to auscultation, breath sounds equal bilaterally, no respiratory distress Cardio:  regular rate and rhythm, no bruits, no LE edema Abdomen:  abdomen gravid, nontender; bowel sounds normal Genital: Defer to GYN Musculoskeletal:  symmetrical muscle groups noted without atrophy or deformity Extremities:  no clubbing, cyanosis, or edema, no deformities, no skin discoloration Neuro:  gait normal; deep tendon reflexes normal and symmetric Psych: well oriented with normal range of affect and appropriate judgment/insight  Assessment and Plan  Well adult exam   Well 41 y.o. female. Counseled on diet and exercise. Advanced directive form provided today.  Politely declined labs today as she has been getting them frequently with OB team.  Follow up in 1 yr or prn. The patient voiced understanding and agreement to the plan.  [place], DO 04/30/22 8:58 AM

## 2022-05-08 ENCOUNTER — Ambulatory Visit: Payer: No Typology Code available for payment source | Admitting: *Deleted

## 2022-05-08 ENCOUNTER — Encounter: Payer: Self-pay | Admitting: *Deleted

## 2022-05-08 ENCOUNTER — Ambulatory Visit: Payer: No Typology Code available for payment source | Attending: Obstetrics and Gynecology

## 2022-05-08 VITALS — BP 119/74 | HR 75

## 2022-05-08 DIAGNOSIS — O0990 Supervision of high risk pregnancy, unspecified, unspecified trimester: Secondary | ICD-10-CM

## 2022-05-08 DIAGNOSIS — Z3A31 31 weeks gestation of pregnancy: Secondary | ICD-10-CM | POA: Diagnosis not present

## 2022-05-08 DIAGNOSIS — O09523 Supervision of elderly multigravida, third trimester: Secondary | ICD-10-CM

## 2022-05-08 DIAGNOSIS — O09293 Supervision of pregnancy with other poor reproductive or obstetric history, third trimester: Secondary | ICD-10-CM

## 2022-05-08 DIAGNOSIS — O09893 Supervision of other high risk pregnancies, third trimester: Secondary | ICD-10-CM

## 2022-05-08 DIAGNOSIS — O09522 Supervision of elderly multigravida, second trimester: Secondary | ICD-10-CM | POA: Diagnosis present

## 2022-05-08 DIAGNOSIS — O09292 Supervision of pregnancy with other poor reproductive or obstetric history, second trimester: Secondary | ICD-10-CM | POA: Insufficient documentation

## 2022-05-08 DIAGNOSIS — O09892 Supervision of other high risk pregnancies, second trimester: Secondary | ICD-10-CM | POA: Diagnosis present

## 2022-05-16 ENCOUNTER — Telehealth (INDEPENDENT_AMBULATORY_CARE_PROVIDER_SITE_OTHER): Payer: No Typology Code available for payment source | Admitting: Certified Nurse Midwife

## 2022-05-16 ENCOUNTER — Encounter: Payer: Self-pay | Admitting: Certified Nurse Midwife

## 2022-05-16 VITALS — BP 117/74 | Wt 165.0 lb

## 2022-05-16 DIAGNOSIS — O09529 Supervision of elderly multigravida, unspecified trimester: Secondary | ICD-10-CM

## 2022-05-16 DIAGNOSIS — Z8759 Personal history of other complications of pregnancy, childbirth and the puerperium: Secondary | ICD-10-CM

## 2022-05-16 DIAGNOSIS — O0990 Supervision of high risk pregnancy, unspecified, unspecified trimester: Secondary | ICD-10-CM

## 2022-05-16 DIAGNOSIS — O09523 Supervision of elderly multigravida, third trimester: Secondary | ICD-10-CM

## 2022-05-16 DIAGNOSIS — Z3A32 32 weeks gestation of pregnancy: Secondary | ICD-10-CM

## 2022-05-16 NOTE — Progress Notes (Signed)
OBSTETRICS PRENATAL VIRTUAL VISIT ENCOUNTER NOTE  Provider location: [facility] [place] [facility]   Patient location: Home  I connected with Stephanie Prince on 05/16/22 at 11:10 AM EDT by MyChart Video Encounter and verified that I am speaking with the correct person using two identifiers. I discussed the limitations, risks, security and privacy concerns of performing an evaluation and management service virtually and the availability of in person appointments. I also discussed with the patient that there may be a patient responsible charge related to this service. The patient expressed understanding and agreed to proceed. Subjective:  Stephanie Prince is a 41 y.o. [phone] at 78w1dbeing seen today for ongoing prenatal care.  She is currently monitored for the following issues for this low-risk pregnancy and has History of severe pre-eclampsia; Supervision of high risk pregnancy, antepartum; Maternal age 39+ multigravida, antepartum; and Lipoma of right axilla on their problem list.  Patient reports no complaints.  Contractions: Not present. Vag. Bleeding: None.  Movement: Present. Denies any leaking of fluid.   The following portions of the patient's history were reviewed and updated as appropriate: allergies, current medications, past family history, past medical history, past social history, past surgical history and problem list.   Objective:   Vitals:   05/16/22 1051  BP: 117/74  Weight: 165 lb (74.8 kg)    Fetal Status:     Movement: Present     General:  Alert, oriented and cooperative. Patient is in no acute distress.  Respiratory: Normal respiratory effort, no problems with respiration noted  Mental Status: Normal mood and affect. Normal behavior. Normal judgment and thought content.  Rest of physical exam deferred due to type of encounter  Imaging: UKoreaMFM OB FOLLOW UP  Result Date:  05/08/2022 ----------------------------------------------------------------------  OBSTETRICS REPORT                       (Signed Final 05/08/2022 10:20 am) ---------------------------------------------------------------------- Patient Info  ID #:       [id number]                         D.O.B.:  111/11/1980(40 yrs)  Name:       Stephanie Prince                 Visit Date: 05/08/2022 09:37 am ---------------------------------------------------------------------- Performed By  Attending:        CSander Nephew     Secondary Phy.:   CWillene Hatchet                   MD  Performed By:     BMargaretann Loveless    Address[address]          [phone] [place] Referred By:      JArtist Pais            Location:         [facility]                                 Fetal [facility] at                                                             [facility]                                                             [facility]  Ref. Address:     [place],                    [place] [address] ---------------------------------------------------------------------- Orders  #  Description                           Code        Ordered By  1  [place] MFM OB FOLLOW UP                   [phone]    [place] ----------------------------------------------------------------------  #  Order #                     Accession #                Episode #  1  [id number]                   [id number]                 [id number] ---------------------------------------------------------------------- Indications  Poor obstetric history: Previous               O09.299  preeclampsia / eclampsia/gestational HTN  Poor obstetric history: Previous preterm       O09.219  delivery, antepartum  Advanced maternal age multigravida 2+,        O38.523  third trimester (40 yrs)  [redacted] weeks gestation of pregnancy                 Z3A.31 ---------------------------------------------------------------------- Vital Signs                            Pulse:  75  BP:          119/74 ---------------------------------------------------------------------- Fetal Evaluation  Num Of Fetuses:         1  Fetal Heart Rate(bpm):  150  Cardiac Activity:       Observed  Presentation:           Breech  Placenta:               Posterior  P. Cord Insertion:      Previously Visualized  Amniotic Fluid  AFI FV:      Within normal limits  AFI Sum(cm)     %Tile  Largest Pocket(cm)  16.06           58          4.76  RUQ(cm)       RLQ(cm)       LUQ(cm)        LLQ(cm)  3.77          3.69          3.84           4.76 ---------------------------------------------------------------------- Biometry  BPD:     78.22  mm     G. Age:  31w 3d         51  %    CI:        71.13   %    70 - 86                                                          FL/HC:      19.3   %    19.3 - 21.3  HC:    295.46   mm     G. Age:  32w 5d         60  %    HC/AC:      1.08        0.96 - 1.17  AC:    274.52   mm     G. Age:  31w 4d         63  %    FL/BPD:     73.0   %    71 - 87  FL:      57.12  mm     G. Age:  30w 0d         12  %    FL/AC:      20.8   %    20 - 24  HUM:      50.8  mm     G. Age:  29w 5d         25  %  LV:        3.5  mm  Est. FW:    1707  gm    3 lb 12 oz      42  % ---------------------------------------------------------------------- OB History  Blood Type:   O+  Gravidity:    5         Term:   2        Prem:   1        SAB:   1  TOP:          0       Ectopic:  0        Living: 3 ---------------------------------------------------------------------- Gestational Age  LMP:           31w 0d        Date:  10/03/21                 EDD:   07/10/22  U/S Today:     31w 3d                                        EDD:   07/07/22  Best:  31w 0d     Det. By:  LMP  (10/03/21)          EDD:   07/10/22 ----------------------------------------------------------------------  Anatomy  Cranium:               Appears normal         LVOT:                   Appears normal  Cavum:                 Appears normal         Aortic Arch:            Previously seen  Ventricles:            Appears normal         Ductal Arch:            Previously seen  Choroid Plexus:        Previously seen        Diaphragm:              Appears normal  Cerebellum:            Previously seen        Stomach:                Appears normal, left                                                                        sided  Posterior Fossa:       Previously seen        Abdomen:                Appears normal  Nuchal Fold:           Previously seen        Abdominal Wall:         Previously seen  Face:                  Orbits and profile     Cord Vessels:           Appears normal (3                         previously seen                                vessel cord)  Lips:                  Previously seen        Kidneys:                Appear normal  Palate:                Not well visualized    Bladder:                Appears normal  Thoracic:              Appears normal         Spine:  Previously seen  Heart:                 Appears normal         Upper Extremities:      Previously seen                         (4CH, axis, and                         situs)  RVOT:                  Appears normal         Lower Extremities:      Previously seen  Other:  3VV and 3VTV visualized. Fetus appears to be a female. Heels/feet,          open hands/5th digits, nasal bone previously visualized. Technically          difficult due to fetal position. ---------------------------------------------------------------------- Cervix Uterus Adnexa  Cervix  Not visualized (advanced GA >24wks)  Uterus  Normal shape and size.  Right Ovary  Not visualized.  Left Ovary  Not visualized. ---------------------------------------------------------------------- Impression  Follow up growth to assess growth given advanced maternal  age.  Normal  interval growth with measurements consistent with  dates  Good fetal movement and amniotic fluid volume ---------------------------------------------------------------------- Recommendations  Follow up growth as clinically indicated. ----------------------------------------------------------------------              Sander Nephew, MD Electronically Signed Final Report   05/08/2022 10:20 am ----------------------------------------------------------------------   Assessment and Plan:  Pregnancy: [id number] at 39w1d1. Supervision of low risk pregnancy, antepartum  2. Hx of PEC -PEC precautions -continue bASA 162 mg  3. AMA - normal growth [place]- no ante testing indicated - delivery 39-40 wks  4. [redacted] weeks gestation   Preterm labor symptoms and general obstetric precautions including but not limited to vaginal bleeding, contractions, leaking of fluid and fetal movement were reviewed in detail with the patient. I discussed the assessment and treatment plan with the patient. The patient was provided an opportunity to ask questions and all were answered. The patient agreed with the plan and demonstrated an understanding of the instructions. The patient was advised to call back or seek an in-person office evaluation/[facility] any [facility] or concerning symptoms. Please refer to After Visit Summary for other counseling recommendations.   I provided 12 minutes of face-to-face time during this encounter.  Return in about 2 weeks (around 05/30/2022).    MJulianne Handler [place]au [place]for [place]

## 2022-05-27 ENCOUNTER — Ambulatory Visit (INDEPENDENT_AMBULATORY_CARE_PROVIDER_SITE_OTHER): Payer: No Typology Code available for payment source | Admitting: Obstetrics and Gynecology

## 2022-05-27 VITALS — BP 128/81 | HR 88 | Wt 167.0 lb

## 2022-05-27 DIAGNOSIS — Z3A33 33 weeks gestation of pregnancy: Secondary | ICD-10-CM

## 2022-05-27 DIAGNOSIS — O0990 Supervision of high risk pregnancy, unspecified, unspecified trimester: Secondary | ICD-10-CM

## 2022-05-27 DIAGNOSIS — O0993 Supervision of high risk pregnancy, unspecified, third trimester: Secondary | ICD-10-CM

## 2022-05-27 NOTE — Progress Notes (Signed)
   PRENATAL VISIT NOTE  Subjective:  Stephanie Prince is a 41 y.o. [phone] at 63w5dbeing seen today for ongoing prenatal care.  She is currently monitored for the following issues for this low-risk pregnancy and has History of severe pre-eclampsia; Supervision of high risk pregnancy, antepartum; Maternal age 39+ multigravida, antepartum; and Lipoma of right axilla on their problem list.  Patient reports no complaints.  Contractions: Not present. Vag. Bleeding: None.  Movement: Present. Denies leaking of fluid.   The following portions of the patient's history were reviewed and updated as appropriate: allergies, current medications, past family history, past medical history, past social history, past surgical history and problem list.   Objective:   Vitals:   05/27/22 1509  BP: 128/81  Pulse: 88  Weight: 167 lb (75.8 kg)    Fetal Status: Fetal Heart Rate (bpm): 137 Fundal Height: 33 cm Movement: Present     General:  Alert, oriented and cooperative. Patient is in no acute distress.  Skin: Skin is warm and dry. No rash noted.   Cardiovascular: Normal heart rate noted  Respiratory: Normal respiratory effort, no problems with respiration noted  Abdomen: Soft, gravid, appropriate for gestational age.  Pain/Pressure: Absent     Pelvic: Cervical exam deferred        Extremities: Normal range of motion.  Edema: None  Mental Status: Normal mood and affect. Normal behavior. Normal judgment and thought content.   Assessment and Plan:  Pregnancy: [id number] 320w5d1. Supervision of high risk pregnancy, antepartum  - Doing well.  - BP is great - Continue BASA  - Discussed primrose oil at 35 weeks.  - AMA MFM guidelines : growth at 32 weeks, consider delivery between 38-39 weeks   Preterm labor symptoms and general obstetric precautions including but not limited to vaginal bleeding, contractions, leaking of fluid and fetal movement were reviewed in detail with the patient. Please refer to  After Visit Summary for other counseling recommendations.   No follow-ups on file.  Future Appointments  Date Time Provider DeL[place]8/24/2023  1:10 PM Constant, PeVickii ChafeMD [facility]9/10/2021  8:50 AM Doyel Mulkern, JeArtist PaisNP [facility]9/04/2022  9:30 AM DuRadene GunningMD [facility]9/14/2023  9:30 AM DuRadene GunningMD [facility]9/21/2023  9:30 AM DuRadene GunningMD [facility]7/17/2024  8:30 AM WeNani RavensNiCrosby OysterDO [facility] PESumrallNP

## 2022-06-09 MED ORDER — HYDROCORTISONE ACETATE 25 MG RE SUPP
25.0000 mg | Freq: Two times a day (BID) | RECTAL | 0 refills | Status: DC
  Filled 2022-06-09: qty 12, 6d supply, fill #0

## 2022-06-10 ENCOUNTER — Other Ambulatory Visit: Payer: Self-pay | Admitting: *Deleted

## 2022-06-10 ENCOUNTER — Telehealth: Payer: Self-pay

## 2022-06-10 ENCOUNTER — Other Ambulatory Visit (HOSPITAL_COMMUNITY): Payer: Self-pay

## 2022-06-10 MED ORDER — HYDROCORTISONE ACETATE 25 MG RE SUPP
25.0000 mg | Freq: Two times a day (BID) | RECTAL | 0 refills | Status: DC
  Filled 2022-06-10: qty 12, 6d supply, fill #0

## 2022-06-10 MED ORDER — NITROGLYCERIN 0.4 % RE OINT
1.0000 [in_us] | TOPICAL_OINTMENT | Freq: Two times a day (BID) | RECTAL | 2 refills | Status: DC | PRN
  Filled 2022-06-10: qty 30, 7d supply, fill #0

## 2022-06-10 NOTE — Telephone Encounter (Signed)
Erin from [facility] requests permission to fill hydrocortisone 25 mg suppository. Routed to correct office.

## 2022-06-12 ENCOUNTER — Other Ambulatory Visit (HOSPITAL_COMMUNITY)
Admission: RE | Admit: 2022-06-12 | Discharge: 2022-06-12 | Disposition: A | Discharge status: Home / Self Care | Payer: No Typology Code available for payment source | Source: Ambulatory Visit | Attending: Obstetrics and Gynecology | Admitting: Obstetrics and Gynecology

## 2022-06-12 ENCOUNTER — Encounter: Payer: Self-pay | Admitting: Obstetrics and Gynecology

## 2022-06-12 ENCOUNTER — Ambulatory Visit (INDEPENDENT_AMBULATORY_CARE_PROVIDER_SITE_OTHER): Payer: No Typology Code available for payment source | Admitting: Obstetrics and Gynecology

## 2022-06-12 ENCOUNTER — Other Ambulatory Visit (HOSPITAL_COMMUNITY): Payer: Self-pay

## 2022-06-12 ENCOUNTER — Other Ambulatory Visit (HOSPITAL_BASED_OUTPATIENT_CLINIC_OR_DEPARTMENT_OTHER): Payer: Self-pay

## 2022-06-12 VITALS — BP 146/83 | HR 94 | Wt 164.0 lb

## 2022-06-12 DIAGNOSIS — O0990 Supervision of high risk pregnancy, unspecified, unspecified trimester: Secondary | ICD-10-CM

## 2022-06-12 LAB — OB RESULTS CONSOLE GBS: GBS: POSITIVE

## 2022-06-12 MED ORDER — LIDOCAINE HCL URETHRAL/MUCOSAL 2 % EX GEL
1.0000 | CUTANEOUS | 2 refills | Status: DC | PRN
  Filled 2022-06-12: qty 30, 6d supply, fill #0

## 2022-06-12 NOTE — Progress Notes (Signed)
   PRENATAL VISIT NOTE  Subjective:  Stephanie Prince is a 41 y.o. [phone] at 89w0dbeing seen today for ongoing prenatal care.  She is currently monitored for the following issues for this low-risk pregnancy and has History of severe pre-eclampsia; Supervision of high risk pregnancy, antepartum; Maternal age 39+ multigravida, antepartum; and Lipoma of right axilla on their problem list.  Patient reports painful hemorrhoid.  Contractions: Irritability. Vag. Bleeding: None.  Movement: Present. Denies leaking of fluid.   The following portions of the patient's history were reviewed and updated as appropriate: allergies, current medications, past family history, past medical history, past social history, past surgical history and problem list.   Objective:   Vitals:   06/12/22 1440  BP: (!) 146/83  Pulse: 94  Weight: 164 lb (74.4 kg)    Fetal Status: Fetal Heart Rate (bpm): 145 Fundal Height: 36 cm Movement: Present  Presentation: Vertex  General:  Alert, oriented and cooperative. Patient is in no acute distress.  Skin: Skin is warm and dry. No rash noted.   Cardiovascular: Normal heart rate noted  Respiratory: Normal respiratory effort, no problems with respiration noted  Abdomen: Soft, gravid, appropriate for gestational age.  Pain/Pressure: Present     Pelvic: Cervical exam performed in the presence of a chaperone Dilation: 1.5 Effacement (%): Thick Station: -3  Extremities: Normal range of motion.  Edema: Trace  Mental Status: Normal mood and affect. Normal behavior. Normal judgment and thought content.   Assessment and Plan:  Pregnancy: [id number] 323w0d. Supervision of high risk pregnancy, antepartum Patient is doing well Advised to continue Tucks pad, preparation H and ice packs Rx lidocaine jelly provided Patient noted to have elevated BP without symptoms. Labs collected. Discussed si/sx of pre-eclampsia. Will monitor BP closely at next appointment Vaginal cultures  collected - Culture, beta strep (group b only) - Cervicovaginal ancillary only( [place]) - CBC - Comprehensive metabolic panel - Protein / creatinine ratio, urine  Preterm labor symptoms and general obstetric precautions including but not limited to vaginal bleeding, contractions, leaking of fluid and fetal movement were reviewed in detail with the patient. Please refer to After Visit Summary for other counseling recommendations.   Return in about 1 week (around 06/19/2022) for in person, ROB, Low risk.  Future Appointments  Date Time Provider De[place]9/10/2021  8:50 AM Rasch, JeArtist PaisNP [facility]9/04/2022  9:30 AM DuRadene GunningMD [facility]9/14/2023  9:30 AM DuRadene GunningMD [facility]9/21/2023  9:30 AM DuRadene GunningMD [facility] CWT[facility]7/17/2024  8:30 AM Wendling, NiCrosby OysterDO [facility]-SW PEC    PeMora BellmanMD

## 2022-06-13 LAB — COMPREHENSIVE METABOLIC PANEL
AG Ratio: 1.1 (calc) (ref 1.0–2.5)
ALT: 14 U/L (ref 6–29)
AST: 22 U/L (ref 10–30)
Albumin: 3.4 g/dL — ABNORMAL LOW (ref 3.6–5.1)
Alkaline phosphatase (APISO): 135 U/L — ABNORMAL HIGH (ref 31–125)
BUN: 10 mg/dL (ref 7–25)
CO2: 22 mmol/L (ref 20–32)
Calcium: 9 mg/dL (ref 8.6–10.2)
Chloride: 101 mmol/L (ref 98–110)
Creat: 0.85 mg/dL (ref 0.50–0.97)
Globulin: 3.1 g/dL (calc) (ref 1.9–3.7)
Glucose, Bld: 78 mg/dL (ref 65–99)
Potassium: 4.6 mmol/L (ref 3.5–5.3)
Sodium: 135 mmol/L (ref 135–146)
Total Bilirubin: 0.5 mg/dL (ref 0.2–1.2)
Total Protein: 6.5 g/dL (ref 6.1–8.1)

## 2022-06-13 LAB — CERVICOVAGINAL ANCILLARY ONLY
Chlamydia: NEGATIVE
Comment: NEGATIVE
Comment: NORMAL
Neisseria Gonorrhea: NEGATIVE

## 2022-06-13 LAB — CBC
HCT: 34.3 % — ABNORMAL LOW (ref 35.0–45.0)
Hemoglobin: 11.7 g/dL (ref 11.7–15.5)
MCH: 29.1 pg (ref 27.0–33.0)
MCHC: 34.1 g/dL (ref 32.0–36.0)
MCV: 85.3 fL (ref 80.0–100.0)
MPV: 12.1 fL (ref 7.5–12.5)
Platelets: 204 10*3/uL (ref 140–400)
RBC: 4.02 10*6/uL (ref 3.80–5.10)
RDW: 12.7 % (ref 11.0–15.0)
WBC: 9.4 10*3/uL (ref 3.8–10.8)

## 2022-06-13 LAB — PROTEIN / CREATININE RATIO, URINE
Creatinine, Urine: 122 mg/dL (ref 20–275)
Protein/Creat Ratio: 213 mg/g creat — ABNORMAL HIGH (ref 24–184)
Protein/Creatinine Ratio: 0.213 mg/mg creat — ABNORMAL HIGH (ref 0.024–0.184)
Total Protein, Urine: 26 mg/dL — ABNORMAL HIGH (ref 5–24)

## 2022-06-14 LAB — CULTURE, BETA STREP (GROUP B ONLY)

## 2022-06-15 LAB — CULTURE, BETA STREP (GROUP B ONLY)
MICRO NUMBER:: 13831128
SPECIMEN QUALITY:: ADEQUATE

## 2022-06-20 ENCOUNTER — Telehealth (HOSPITAL_COMMUNITY): Payer: Self-pay | Admitting: *Deleted

## 2022-06-20 ENCOUNTER — Ambulatory Visit (INDEPENDENT_AMBULATORY_CARE_PROVIDER_SITE_OTHER): Payer: No Typology Code available for payment source | Admitting: Obstetrics and Gynecology

## 2022-06-20 ENCOUNTER — Encounter (HOSPITAL_COMMUNITY): Payer: Self-pay | Admitting: *Deleted

## 2022-06-20 VITALS — BP 146/85 | HR 80 | Wt 167.0 lb

## 2022-06-20 DIAGNOSIS — O99820 Streptococcus B carrier state complicating pregnancy: Secondary | ICD-10-CM

## 2022-06-20 DIAGNOSIS — O0993 Supervision of high risk pregnancy, unspecified, third trimester: Secondary | ICD-10-CM

## 2022-06-20 DIAGNOSIS — O139 Gestational [pregnancy-induced] hypertension without significant proteinuria, unspecified trimester: Secondary | ICD-10-CM

## 2022-06-20 DIAGNOSIS — O133 Gestational [pregnancy-induced] hypertension without significant proteinuria, third trimester: Secondary | ICD-10-CM

## 2022-06-20 DIAGNOSIS — O0990 Supervision of high risk pregnancy, unspecified, unspecified trimester: Secondary | ICD-10-CM

## 2022-06-20 DIAGNOSIS — Z3A37 37 weeks gestation of pregnancy: Secondary | ICD-10-CM

## 2022-06-20 NOTE — Telephone Encounter (Signed)
Preadmission screen  

## 2022-06-20 NOTE — Progress Notes (Signed)
   PRENATAL VISIT NOTE  Subjective:  Stephanie Prince is a 41 y.o. [phone] at 36w1dbeing seen today for ongoing prenatal care.  She is currently monitored for the following issues for this high-risk pregnancy and has History of severe pre-eclampsia; Supervision of high risk pregnancy, antepartum; Maternal age 39+ multigravida, antepartum; and Lipoma of right axilla on their problem list.  Patient reports no complaints.  Contractions: Not present. Vag. Bleeding: None.  Movement: Present. Denies leaking of fluid.   The following portions of the patient's history were reviewed and updated as appropriate: allergies, current medications, past family history, past medical history, past social history, past surgical history and problem list.   Objective:   Vitals:   06/20/22 0854  BP: (!) 146/85  Pulse: 80  Weight: 167 lb (75.8 kg)    Fetal Status: Fetal Heart Rate (bpm): 130 Fundal Height: 37 cm Movement: Present  Presentation: Vertex  General:  Alert, oriented and cooperative. Patient is in no acute distress.  Skin: Skin is warm and dry. No rash noted.   Cardiovascular: Normal heart rate noted  Respiratory: Normal respiratory effort, no problems with respiration noted  Abdomen: Soft, gravid, appropriate for gestational age.  Pain/Pressure: Absent     Pelvic: Cervical exam performed in the presence of a chaperone Dilation: 2 Effacement (%): Thick Station: -2  Extremities: Normal range of motion.  Edema: None  Mental Status: Normal mood and affect. Normal behavior. Normal judgment and thought content.   Assessment and Plan:  Pregnancy: [id number] 34w1d1. Gestational hypertension affecting fourth pregnancy  2nd elevated BP- 1 week apart.  Baseline labs do not indicate Pre- E She is asymptomatic Induction scheduled for Sunday 9/2, this day works best for her husbands work schedule.   2. Supervision of high risk pregnancy, antepartum  Doing well, feels good.  + fetal movement.    Term labor symptoms and general obstetric precautions including but not limited to vaginal bleeding, contractions, leaking of fluid and fetal movement were reviewed in detail with the patient. Please refer to After Visit Summary for other counseling recommendations.   No follow-ups on file.  Future Appointments  Date Time Provider DeElderon9/12/2021  6:45 AM MC-LD SCHED ROOM [facility]-INDC None  06/26/2022  9:30 AM DuRadene GunningMD [facility] CW[facility]9/14/2023  9:30 AM DuRadene GunningMD [facility]9/21/2023  9:30 AM DuRadene GunningMD [facility]WS[facility]7/17/2024  8:30 AM WeNani RavensNiCrosby OysterDO LBPC-SW PEPenningtonNP

## 2022-06-21 ENCOUNTER — Other Ambulatory Visit: Payer: Self-pay | Admitting: Advanced Practice Midwife

## 2022-06-22 ENCOUNTER — Inpatient Hospital Stay (HOSPITAL_COMMUNITY): Payer: No Typology Code available for payment source

## 2022-06-22 ENCOUNTER — Inpatient Hospital Stay (HOSPITAL_COMMUNITY): Payer: No Typology Code available for payment source | Admitting: Anesthesiology

## 2022-06-22 ENCOUNTER — Inpatient Hospital Stay (HOSPITAL_COMMUNITY)
Admission: AD | Admit: 2022-06-22 | Discharge: 2022-06-23 | DRG: 807 | Disposition: A | Discharge status: Home / Self Care | Payer: No Typology Code available for payment source | Attending: Obstetrics & Gynecology | Admitting: Obstetrics & Gynecology

## 2022-06-22 ENCOUNTER — Encounter (HOSPITAL_COMMUNITY): Payer: Self-pay | Admitting: Family Medicine

## 2022-06-22 DIAGNOSIS — O99824 Streptococcus B carrier state complicating childbirth: Secondary | ICD-10-CM | POA: Diagnosis present

## 2022-06-22 DIAGNOSIS — O134 Gestational [pregnancy-induced] hypertension without significant proteinuria, complicating childbirth: Principal | ICD-10-CM | POA: Diagnosis present

## 2022-06-22 DIAGNOSIS — Z3A37 37 weeks gestation of pregnancy: Secondary | ICD-10-CM

## 2022-06-22 DIAGNOSIS — O139 Gestational [pregnancy-induced] hypertension without significant proteinuria, unspecified trimester: Secondary | ICD-10-CM

## 2022-06-22 DIAGNOSIS — O133 Gestational [pregnancy-induced] hypertension without significant proteinuria, third trimester: Principal | ICD-10-CM | POA: Diagnosis present

## 2022-06-22 LAB — TYPE AND SCREEN
ABO/RH(D): O POS
Antibody Screen: NEGATIVE

## 2022-06-22 LAB — COMPREHENSIVE METABOLIC PANEL
ALT: 19 U/L (ref 0–44)
AST: 27 U/L (ref 15–41)
Albumin: 2.3 g/dL — ABNORMAL LOW (ref 3.5–5.0)
Alkaline Phosphatase: 102 U/L (ref 38–126)
Anion gap: 9 (ref 5–15)
BUN: 6 mg/dL (ref 6–20)
CO2: 21 mmol/L — ABNORMAL LOW (ref 22–32)
Calcium: 8.4 mg/dL — ABNORMAL LOW (ref 8.9–10.3)
Chloride: 104 mmol/L (ref 98–111)
Creatinine, Ser: 0.67 mg/dL (ref 0.44–1.00)
GFR, Estimated: >60 mL/min (ref >60)
Glucose, Bld: 114 mg/dL — ABNORMAL HIGH (ref 70–99)
Potassium: 3.6 mmol/L (ref 3.5–5.1)
Sodium: 134 mmol/L — ABNORMAL LOW (ref 135–145)
Total Bilirubin: 0.5 mg/dL (ref 0.3–1.2)
Total Protein: 5.7 g/dL — ABNORMAL LOW (ref 6.5–8.1)

## 2022-06-22 LAB — CBC
HCT: 35.3 % — ABNORMAL LOW (ref 36.0–46.0)
HCT: 32.9 % — ABNORMAL LOW (ref 36.0–46.0)
Hemoglobin: 12 g/dL (ref 12.0–15.0)
Hemoglobin: 11.3 g/dL — ABNORMAL LOW (ref 12.0–15.0)
MCH: 29.4 pg (ref 26.0–34.0)
MCH: 29.8 pg (ref 26.0–34.0)
MCHC: 34 g/dL (ref 30.0–36.0)
MCHC: 34.3 g/dL (ref 30.0–36.0)
MCV: 86.5 fL (ref 80.0–100.0)
MCV: 86.8 fL (ref 80.0–100.0)
Platelets: 197 10*3/uL (ref 150–400)
Platelets: 175 10*3/uL (ref 150–400)
RBC: 4.08 MIL/uL (ref 3.87–5.11)
RBC: 3.79 MIL/uL — ABNORMAL LOW (ref 3.87–5.11)
RDW: 13.6 % (ref 11.5–15.5)
RDW: 13.8 % (ref 11.5–15.5)
WBC: 8.4 10*3/uL (ref 4.0–10.5)
WBC: 16.3 10*3/uL — ABNORMAL HIGH (ref 4.0–10.5)
nRBC: 0 % (ref 0.0–0.2)
nRBC: 0 % (ref 0.0–0.2)

## 2022-06-22 LAB — RPR: RPR Ser Ql: NONREACTIVE

## 2022-06-22 MED ORDER — BUPIVACAINE HCL (PF) 0.25 % IJ SOLN
INTRAMUSCULAR | Status: DC | PRN
  Administered 2022-06-22 (×2): 5 mL via EPIDURAL

## 2022-06-22 MED ORDER — OXYCODONE-ACETAMINOPHEN 5-325 MG PO TABS: 2.0000 | ORAL_TABLET | ORAL | Status: DC | PRN

## 2022-06-22 MED ORDER — PHENYLEPHRINE 80 MCG/ML (10ML) SYRINGE FOR IV PUSH (FOR BLOOD PRESSURE SUPPORT): 80.0000 ug | PREFILLED_SYRINGE | INTRAVENOUS | Status: DC | PRN

## 2022-06-22 MED ORDER — FENTANYL CITRATE (PF) 100 MCG/2ML IJ SOLN
100.0000 ug | Freq: Once | INTRAMUSCULAR | Status: AC
  Administered 2022-06-22 (×2): 50 ug via INTRAVENOUS
  Filled 2022-06-22: qty 2

## 2022-06-22 MED ORDER — OXYTOCIN BOLUS FROM INFUSION
333.0000 mL | Freq: Once | INTRAVENOUS | Status: AC
  Administered 2022-06-22: 333 mL via INTRAVENOUS

## 2022-06-22 MED ORDER — IBUPROFEN 600 MG PO TABS
600.0000 mg | ORAL_TABLET | Freq: Four times a day (QID) | ORAL | Status: DC
  Administered 2022-06-22 – 2022-06-23 (×4): 600 mg via ORAL
  Filled 2022-06-22 (×4): qty 1

## 2022-06-22 MED ORDER — DIPHENHYDRAMINE HCL 25 MG PO CAPS: 25.0000 mg | ORAL_CAPSULE | Freq: Four times a day (QID) | ORAL | Status: DC | PRN

## 2022-06-22 MED ORDER — EPHEDRINE 5 MG/ML INJ
10.0000 mg | INTRAVENOUS | Status: DC | PRN
  Administered 2022-06-22: 10 mg via INTRAVENOUS

## 2022-06-22 MED ORDER — LACTATED RINGERS IV SOLN
500.0000 mL | Freq: Once | INTRAVENOUS | Status: AC
  Administered 2022-06-22: 500 mL via INTRAVENOUS

## 2022-06-22 MED ORDER — SENNOSIDES-DOCUSATE SODIUM 8.6-50 MG PO TABS
2.0000 | ORAL_TABLET | Freq: Every day | ORAL | Status: DC
  Administered 2022-06-23: 2 via ORAL
  Filled 2022-06-22: qty 2

## 2022-06-22 MED ORDER — ACETAMINOPHEN 325 MG PO TABS: 650.0000 mg | ORAL_TABLET | ORAL | Status: DC | PRN

## 2022-06-22 MED ORDER — AMLODIPINE BESYLATE 5 MG PO TABS
5.0000 mg | ORAL_TABLET | Freq: Every day | ORAL | Status: DC
  Filled 2022-06-22: qty 1

## 2022-06-22 MED ORDER — DIBUCAINE (PERIANAL) 1 % EX OINT: 1.0000 | TOPICAL_OINTMENT | CUTANEOUS | Status: DC | PRN

## 2022-06-22 MED ORDER — EPHEDRINE 5 MG/ML INJ
10.0000 mg | INTRAVENOUS | Status: DC | PRN
Start: 2022-06-22 — End: 2022-06-22

## 2022-06-22 MED ORDER — FENTANYL-BUPIVACAINE-NACL 0.5-0.125-0.9 MG/250ML-% EP SOLN: 12.0000 mL/h | EPIDURAL | Status: DC | PRN

## 2022-06-22 MED ORDER — LIDOCAINE HCL (PF) 1 % IJ SOLN: 30.0000 mL | INTRAMUSCULAR | Status: DC | PRN

## 2022-06-22 MED ORDER — LIDOCAINE-EPINEPHRINE (PF) 2 %-1:200000 IJ SOLN
INTRAMUSCULAR | Status: DC | PRN
  Administered 2022-06-22: 5 mL via EPIDURAL

## 2022-06-22 MED ORDER — OXYTOCIN-SODIUM CHLORIDE 30-0.9 UT/500ML-% IV SOLN: 2.5000 [IU]/h | INTRAVENOUS | Status: DC

## 2022-06-22 MED ORDER — OXYCODONE-ACETAMINOPHEN 5-325 MG PO TABS: 1.0000 | ORAL_TABLET | ORAL | Status: DC | PRN

## 2022-06-22 MED ORDER — MISOPROSTOL 50MCG HALF TABLET
50.0000 ug | ORAL_TABLET | Freq: Once | ORAL | Status: AC
  Administered 2022-06-22: 50 ug via ORAL
  Filled 2022-06-22: qty 1

## 2022-06-22 MED ORDER — FENTANYL-BUPIVACAINE-NACL 0.5-0.125-0.9 MG/250ML-% EP SOLN
12.0000 mL/h | EPIDURAL | Status: DC | PRN
  Administered 2022-06-22: 12 mL/h via EPIDURAL
  Filled 2022-06-22: qty 250

## 2022-06-22 MED ORDER — PENICILLIN G POT IN DEXTROSE 60000 UNIT/ML IV SOLN
3.0000 10*6.[IU] | INTRAVENOUS | Status: DC
  Administered 2022-06-22 (×2): 3 10*6.[IU] via INTRAVENOUS
  Filled 2022-06-22 (×6): qty 50

## 2022-06-22 MED ORDER — LACTATED RINGERS IV SOLN: 500.0000 mL | Freq: Once | INTRAVENOUS | Status: DC

## 2022-06-22 MED ORDER — OXYCODONE HCL 5 MG PO TABS: 10.0000 mg | ORAL_TABLET | ORAL | Status: DC | PRN

## 2022-06-22 MED ORDER — PRENATAL MULTIVITAMIN CH
1.0000 | ORAL_TABLET | Freq: Every day | ORAL | Status: DC
  Administered 2022-06-23: 1 via ORAL
  Filled 2022-06-22: qty 1

## 2022-06-22 MED ORDER — WITCH HAZEL-GLYCERIN EX PADS: 1.0000 | MEDICATED_PAD | CUTANEOUS | Status: DC | PRN

## 2022-06-22 MED ORDER — LACTATED RINGERS IV SOLN: INTRAVENOUS | Status: DC

## 2022-06-22 MED ORDER — ONDANSETRON HCL 4 MG/2ML IJ SOLN: 4.0000 mg | INTRAMUSCULAR | Status: DC | PRN

## 2022-06-22 MED ORDER — PHENYLEPHRINE 80 MCG/ML (10ML) SYRINGE FOR IV PUSH (FOR BLOOD PRESSURE SUPPORT)
80.0000 ug | PREFILLED_SYRINGE | INTRAVENOUS | Status: DC | PRN
  Administered 2022-06-22 (×2): 80 ug via INTRAVENOUS

## 2022-06-22 MED ORDER — EPHEDRINE 5 MG/ML INJ: 10.0000 mg | INTRAVENOUS | Status: DC | PRN

## 2022-06-22 MED ORDER — TERBUTALINE SULFATE 1 MG/ML IJ SOLN: 0.2500 mg | Freq: Once | INTRAMUSCULAR | Status: DC | PRN

## 2022-06-22 MED ORDER — OXYTOCIN-SODIUM CHLORIDE 30-0.9 UT/500ML-% IV SOLN
1.0000 m[IU]/min | INTRAVENOUS | Status: DC
  Administered 2022-06-22: 2 m[IU]/min via INTRAVENOUS

## 2022-06-22 MED ORDER — ONDANSETRON HCL 4 MG PO TABS: 4.0000 mg | ORAL_TABLET | ORAL | Status: DC | PRN

## 2022-06-22 MED ORDER — ONDANSETRON HCL 4 MG/2ML IJ SOLN
4.0000 mg | Freq: Four times a day (QID) | INTRAMUSCULAR | Status: DC | PRN
  Administered 2022-06-22: 4 mg via INTRAVENOUS
  Filled 2022-06-22: qty 2

## 2022-06-22 MED ORDER — OXYCODONE HCL 5 MG PO TABS: 5.0000 mg | ORAL_TABLET | ORAL | Status: DC | PRN

## 2022-06-22 MED ORDER — SODIUM CHLORIDE 0.9 % IV SOLN
5.0000 10*6.[IU] | Freq: Once | INTRAVENOUS | Status: AC
  Administered 2022-06-22: 5 10*6.[IU] via INTRAVENOUS
  Filled 2022-06-22: qty 5

## 2022-06-22 MED ORDER — SIMETHICONE 80 MG PO CHEW: 80.0000 mg | CHEWABLE_TABLET | ORAL | Status: DC | PRN

## 2022-06-22 MED ORDER — LACTATED RINGERS IV SOLN: 500.0000 mL | INTRAVENOUS | Status: DC | PRN

## 2022-06-22 MED ORDER — DIPHENHYDRAMINE HCL 50 MG/ML IJ SOLN: 12.5000 mg | INTRAMUSCULAR | Status: DC | PRN

## 2022-06-22 MED ORDER — BENZOCAINE-MENTHOL 20-0.5 % EX AERO: 1.0000 | INHALATION_SPRAY | CUTANEOUS | Status: DC | PRN

## 2022-06-22 MED ORDER — MISOPROSTOL 25 MCG QUARTER TABLET: 25.0000 ug | ORAL_TABLET | Freq: Once | ORAL | Status: DC

## 2022-06-22 MED ORDER — COCONUT OIL OIL: 1.0000 | TOPICAL_OIL | Status: DC | PRN

## 2022-06-22 MED ORDER — FUROSEMIDE 20 MG PO TABS
20.0000 mg | ORAL_TABLET | Freq: Every day | ORAL | Status: DC
  Filled 2022-06-22: qty 1

## 2022-06-22 MED ORDER — SOD CITRATE-CITRIC ACID 500-334 MG/5ML PO SOLN: 30.0000 mL | ORAL | Status: DC | PRN

## 2022-06-22 NOTE — Progress Notes (Signed)
Patient ID: MARGET OUTTEN, female   DOB: Nov 15, 1980, 41 y.o.   MRN: [id number]  Labor Progress Note LARCENIA HOLADAY is a 42 y.o. [id number] at 79w3dpresented for IOL for gHTN   S:  Pt using the birth ball, FOB at bedside for support. Feeling some bladder pressure with contractions but they are not very strong.  O:  BP 132/88   Pulse 90   Temp 98.2 F (36.8 C) (Oral)   LMP 10/03/2021  EFM: baseline 135 bpm/ moderate variability/ 15x15 accels/ no decels  Toco/IUPC: irregular, some stronger than others SVE: Dilation: 4 Effacement (%): 50 Cervical Position: Middle Station: -2 Presentation: Vertex Exam by:: JCandie ChromanCNM Pitocin: None, will begin   A/P: 41y.o. [id number]. Labor: Latent, progressing slowly but no regular contraction pattern yet. Was q2-79m44mbut has spaced out to irregular and q10+min. Cervix still 50% effaced but very soft and stretchy. Will begin pitocin titration. 2. FWB: Cat 1 3. Pain: Well tolerated, planning epidural. Order is in for epidural upon request. 4. gHTN: Last BP 132/88, no s/sx of PEC  Anticipate SVD.  JamGabriel CarinaNM 2:44 PM

## 2022-06-22 NOTE — Lactation Note (Signed)
This note was copied from a baby's chart. Lactation Consultation Note  Patient Name: Stephanie Prince GQHQI'X Date: 06/22/2022   Age:39 hours LC talked to RN, Jaymes Graff, infant latched and doing well. LC to follow birth parent and baby once on the floor.   Maternal Data    Feeding    LATCH Score                    Lactation Tools Discussed/Used    Interventions    Discharge    Consult Status      Elaynah Virginia  Nicholson-Springer 06/22/2022, 6:44 PM

## 2022-06-22 NOTE — Progress Notes (Signed)
Patient ID: Stephanie Prince, female   DOB: 1981/08/03, 41 y.o.   MRN: [id number]  Labor Progress Note Stephanie Prince is a 20 y.o. [phone] at 69w3dpresented for IOL for gHTN   S:  Pt resting in bed, semi-reclined, just got epidural and is feeling more comfortable. Eager to have water broken.  O:  BP 114/74   Pulse (!) 119   Temp 98.2 F (36.8 C) (Oral)   Ht '5\' 4"'$  (1.626 m)   Wt 167 lb (75.8 kg)   LMP 10/03/2021   BMI 28.67 kg/m  EFM: baseline 135 bpm/ moderate variability/ 15x15 accels/ no decels  Toco/IUPC: q3-435m, strong SVE: Dilation: 5 Effacement (%): 50 Cervical Position: Middle Station: 0 Presentation: Vertex Exam by:: StJaymes GraffN Pitocin: 62m12min  A/P: 38 26o. [id number] Labor: Active labor pattern. AROM performed with return of copious clear fluid. Head well applied and brought down to 0 station. 2. FWB: Cat 1 3. Pain: well-controlled with epidural in place 4. gHTN: Last BP 138/64, no s/sx of PEC  Anticipate SVD.  JamiGabriel CarinaM 5:02 PM

## 2022-06-22 NOTE — Lactation Note (Signed)
This note was copied from a baby's chart. Lactation Consultation Note  Patient Name: Stephanie Prince WFUXN'A Date: 06/22/2022 Reason for consult: Initial assessment;Mother's request;Early term 37-38.6wks;Breastfeeding assistance;Other (Comment) (PIH ( amlodipine, lasix)) Age:39 hours LC assisted with latch with signs of milk transfer. Infant latched for 12 mins. Some pain with the latch, resolved with flanging out of bottom lip.   Plan 1. To feed based on cues 8-12x 24hr period. Birth parent to offer breasts and look for signs of milk transfer.  2. If unable to latch, offer colostrum on spoon from hand expression or at the breast then try a latch.  All questions answered at the end of the visit.  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 Birth parent aware to call [facility] once choice is made.   Maternal Data Has patient been taught Hand Expression?: Yes Does the patient have breastfeeding experience prior to this delivery?: Yes How long did the patient breastfeed?: 3 months followed by pumping for one year  Feeding Mother's Current Feeding Choice: Breast Milk  LATCH Score Latch: Repeated attempts needed to sustain latch, nipple held in mouth throughout feeding, stimulation needed to elicit sucking reflex.  Audible Swallowing: Spontaneous and intermittent  Type of Nipple: Everted at rest and after stimulation  Comfort (Breast/Nipple): Soft / non-tender  Hold (Positioning): Assistance needed to correctly position infant at breast and maintain latch.  LATCH Score: 8   Lactation Tools Discussed/Used    Interventions Interventions: Breast feeding basics reviewed;Assisted with latch;Skin to skin;Breast massage;Hand express;Breast compression;Adjust position;Support pillows;Position options;Expressed milk;Education;LC Magazine features editor;Tour manager education  Discharge [place] Program: No  Consult Status Consult Status: Follow-up Date:  06/23/22 Follow-up type: In-patient    Stephanie Astorino  Prince 06/22/2022, 10:24 PM

## 2022-06-22 NOTE — Anesthesia Preprocedure Evaluation (Addendum)
Anesthesia Evaluation  Patient identified by MRN, date of birth, ID band Patient awake    Reviewed: Allergy & Precautions, Patient's Chart, lab work & pertinent test results  Airway Mallampati: I       Dental   Pulmonary    Pulmonary exam normal        Cardiovascular hypertension, Normal cardiovascular exam     Neuro/Psych    GI/Hepatic negative GI ROS, Neg liver ROS,   Endo/Other    Renal/GU      Musculoskeletal   Abdominal   Peds  Hematology   Anesthesia Other Findings   Reproductive/Obstetrics (+) Pregnancy                            Anesthesia Physical Anesthesia Plan  ASA: 2  Anesthesia Plan: Epidural   Post-op Pain Management:    Induction:   PONV Risk Score and Plan: 0  Airway Management Planned: Natural Airway and Simple Face Mask  Additional Equipment: None  Intra-op Plan:   Post-operative Plan:   Informed Consent: I have reviewed the patients History and Physical, chart, labs and discussed the procedure including the risks, benefits and alternatives for the proposed anesthesia with the patient or authorized representative who has indicated his/her understanding and acceptance.       Plan Discussed with:   Anesthesia Plan Comments: (Lab Results      Component                Value               Date                      WBC                      8.4                 06/22/2022                HGB                      12.0                06/22/2022                HCT                      35.3 (L)            06/22/2022                MCV                      86.5                06/22/2022                PLT                      197                 06/22/2022           )       Anesthesia Quick Evaluation

## 2022-06-22 NOTE — Discharge Summary (Signed)
Postpartum Discharge Summary  Date of Service updated***     Patient Name: Stephanie Prince DOB: April 07, 1981 MRN: [id number]  Date of admission: 06/22/2022 Delivery date:06/22/2022  Delivering provider: Gaylan Gerold R  Date of discharge: 06/22/2022  Admitting diagnosis: Gestational hypertension, third trimester [O13.3] Intrauterine pregnancy: [redacted]w[redacted]d    Secondary diagnosis:  Principal Problem:   Gestational hypertension, third trimester  Additional problems: None    Discharge diagnosis: Term Pregnancy Delivered and Gestational Hypertension                                              Post partum procedures: None Augmentation: AROM, Pitocin, and Cytotec Complications: None  Hospital course: Induction of Labor With Vaginal Delivery   41y.o. yo [phone] 331w3das admitted to the hospital 06/22/2022 for induction of labor.  Indication for induction: Gestational hypertension.  Patient had an uncomplicated labor course as follows: Membrane Rupture Time/Date: 4:29 PM ,06/22/2022   Delivery Method:Vaginal, Spontaneous  Episiotomy: None  Lacerations:  None  Details of delivery can be found in separate delivery note.  Patient had a routine postpartum course. Patient is discharged home 06/22/22.  Newborn Data: Birth date:06/22/2022  Birth time:6:04 PM  Gender:Female  Living status:Living  Apgars:9 ,9  Weight:   Magnesium Sulfate received: No BMZ received: No Rhophylac:N/A MMR:No T-DaP: Declined Flu: No Transfusion:No  Physical exam  Vitals:   06/22/22 1831 06/22/22 1846 06/22/22 1903 06/22/22 1911  BP: 111/62 125/69 116/60 (!) 140/79  Pulse: 75 70 72 85  Temp:      TempSrc:      Weight:      Height:       General: {Exam; general:21111117} Lochia: {Desc; appropriate/inappropriate:30686::"appropriate"} Uterine Fundus: {Desc; firm/soft:30687} Incision: {Exam; incision:21111123} DVT Evaluation: {Exam; dvt:2111122} Labs: Lab Results  Component Value Date   WBC 8.4  06/22/2022   HGB 12.0 06/22/2022   HCT 35.3 (L) 06/22/2022   MCV 86.5 06/22/2022   PLT 197 06/22/2022      Latest Ref Rng & Units 06/12/2022   12:00 AM  CMP  Glucose 65 - 99 mg/dL 78   BUN 7 - 25 mg/dL 10   Creatinine 0.50 - 0.97 mg/dL 0.85   Sodium 135 - 146 mmol/L 135   Potassium 3.5 - 5.3 mmol/L 4.6   Chloride 98 - 110 mmol/L 101   CO2 20 - 32 mmol/L 22   Calcium 8.6 - 10.2 mg/dL 9.0   Total Protein 6.1 - 8.1 g/dL 6.5   Total Bilirubin 0.2 - 1.2 mg/dL 0.5   AST 10 - 30 U/L 22   ALT 6 - 29 U/L 14    Edinburgh Score:    03/01/2019   11:57 PM  Edinburgh Postnatal Depression Scale Screening Tool  I have been able to laugh and see the funny side of things. 0  I have looked forward with enjoyment to things. 0  I have blamed myself unnecessarily when things went wrong. 0  I have been anxious or worried for no good reason. 0  I have felt scared or panicky for no good reason. 0  Things have been getting on top of me. 0  I have been so unhappy that I have had difficulty sleeping. 0  I have felt sad or miserable. 0  I have been so unhappy that I have been crying. 0  The thought of harming myself has occurred to me. 0  Edinburgh Postnatal Depression Scale Total 0     After visit meds:  Allergies as of 06/22/2022   No Known Allergies   Med Rec must be completed prior to using this [facility]***      Discharge home in stable condition Infant Feeding: {Baby feeding:23562} Infant Disposition:{CHL IP OB HOME WITH QGQCRS:32201} Discharge instruction: per After Visit Summary and Postpartum booklet. Activity: Advance as tolerated. Pelvic rest for 6 weeks.  Diet: {OB DPII:15516144}  Future Appointments: Future Appointments  Date Time Provider [place]  07/22/2022  1:30 PM Renee Harder, CNM [facility]  05/06/2023  8:30 AM Wendling, Crosby Oyster, DO [facility]   Follow up Visit: Message sent to [place] for BP check by Gaylan Gerold, CNM on 06/22/22   Please schedule this patient for a In person postpartum visit in 4 weeks with the following provider: APP. Additional Postpartum F/U:BP check 1 week  High risk pregnancy complicated by: HTN Delivery mode:  Vaginal, Spontaneous  Anticipated Birth Control:   None, considering vasectomy   06/22/2022 Gabriel Carina, CNM

## 2022-06-22 NOTE — Anesthesia Procedure Notes (Signed)
Epidural Patient location during procedure: OB Start time: 06/22/2022 4:10 PM End time: 06/22/2022 4:16 PM  Staffing Anesthesiologist: Effie Berkshire, MD Performed: anesthesiologist   Preanesthetic Checklist Completed: patient identified, IV checked, site marked, risks and benefits discussed, surgical consent, monitors and equipment checked, pre-op evaluation and timeout performed  Epidural Patient position: sitting Prep: DuraPrep Patient monitoring: heart rate, continuous pulse ox and blood pressure Approach: midline Location: L3-L4 Injection technique: LOR saline  Needle:  Needle type: Tuohy  Needle gauge: 17 G Needle length: 9 cm Catheter type: closed end flexible Catheter size: 20 Guage Test dose: negative and 1.5% lidocaine  Assessment Events: blood not aspirated, injection not painful, no injection resistance and no paresthesia  Additional Notes LOR @ 5  Patient identified. Risks/Benefits/Options discussed with patient including but not limited to bleeding, infection, nerve damage, paralysis, failed block, incomplete pain control, headache, blood pressure changes, nausea, vomiting, reactions to medications, itching and postpartum back pain. Confirmed with bedside nurse the patient's most recent platelet count. Confirmed with patient that they are not currently taking any anticoagulation, have any bleeding history or any family history of bleeding disorders. Patient expressed understanding and wished to proceed. All questions were answered. Sterile technique was used throughout the entire procedure. Please see nursing notes for vital signs. Test dose was given through epidural catheter and negative prior to continuing to dose epidural or start infusion. Warning signs of high block given to the patient including shortness of breath, tingling/numbness in hands, complete motor block, or any concerning symptoms with instructions to call for help. Patient was given instructions on fall  risk and not to get out of bed. All questions and concerns addressed with instructions to call with any issues or inadequate analgesia.    Reason for block:procedure for pain

## 2022-06-22 NOTE — H&P (Signed)
OBSTETRIC ADMISSION HISTORY AND PHYSICAL  Stephanie Prince is a 41 y.o. female [phone] with IUP at 45w3dby LMP presenting for IOL for gHTN. She reports +FMs, No LOF, no VB, no blurry vision, headaches or peripheral edema, and RUQ pain.  She plans on breast feeding. She is currently undecided about her choices for birth control. [facility] Dating: By LMP --->  Estimated Date of Delivery: 07/10/22 Sono:  '@[redacted]w[redacted]d'$ , CWD, normal anatomy, breech presentation, 1707g, 42% EFW  Prenatal History/Complications: gHTN  Past Medical History: Past Medical History:  Diagnosis Date   Anemia    History of gestational hypertension    History of pre-eclampsia    Hx of varicella    Past Surgical History: Past Surgical History:  Procedure Laterality Date   APPENDECTOMY     WISDOM TOOTH EXTRACTION     Obstetrical History: OB History     Gravida  5   Para  3   Term  2   Preterm  1   AB  1   Living  3      SAB  1   IAB      Ectopic      Multiple  0   Live Births  3          Social History Social History   Socioeconomic History   Marital status: Married    Spouse name: Not on file   Number of children: Not on file   Years of education: Not on file   Highest education level: Not on file  Occupational History   Not on file  Tobacco Use   Smoking status: Never   Smokeless tobacco: Never  Vaping Use   Vaping Use: Never used  Substance and Sexual Activity   Alcohol use: No   Drug use: No   Sexual activity: Yes    Birth control/protection: None  Other Topics Concern   Not on file  Social History Narrative   Not on file   Social Determinants of Health   Financial Resource Strain: Low Risk  (02/25/2019)   Overall Financial Resource Strain (CARDIA)    Difficulty of Paying Living Expenses: Not hard at all  Food Insecurity: No Food Insecurity (02/25/2019)   Hunger Vital Sign    Worried About Running Out of Food in the Last Year:  Never true    Ran Out of Food in the Last Year: Never true  Transportation Needs: Unknown (02/25/2019)   PRAPARE - THydrologist(Medical): No    Lack of Transportation (Non-Medical): Not on file  Physical Activity: Not on file  Stress: No Stress Concern Present (02/25/2019)   [place]   Feeling of Stress : Only a little  Social Connections: Not on file   Family History: Family History  Problem Relation Age of Onset   Hypertension Mother    Other Father        brain tumor   Cancer Father    Hypertension Maternal Grandmother    Heart failure Maternal Grandmother    Alcohol abuse Maternal Grandfather    Cancer Paternal Grandfather    Breast cancer Maternal Aunt    Cancer Maternal Uncle    Cancer Maternal Aunt    Allergies: No Known Allergies  Medications Prior to Admission  Medication Sig Dispense Refill Last Dose   Prenatal Vit-Fe Fumarate-FA (MULTIVITAMIN-PRENATAL) 27-0.8 MG TABS tablet Take 1 tablet  by mouth daily at 12 noon.   06/21/2022   hydrocortisone (ANUSOL-HC) 25 MG suppository Place 1 suppository (25 mg total) rectally 2 (two) times daily. (Patient not taking: Reported on 06/22/2022) 12 suppository 0 Not Taking   lidocaine (XYLOCAINE) 2 % jelly Apply 1 Application topically as needed. (Patient not taking: Reported on 06/22/2022) 30 mL 2 Not Taking   Nitroglycerin 0.4 % OINT Place 1 inch rectally 2 (two) times daily as needed for up to 7 days. 30 g 2    Review of Systems  All systems reviewed and negative except as stated in HPI  Blood pressure (!) 145/84, pulse 77, temperature 98.2 F (36.8 C), temperature source Oral, last menstrual period 10/03/2021, unknown if currently breastfeeding. General appearance: alert, cooperative, appears stated age, and no distress Lungs: clear to auscultation bilaterally Heart: regular rate and rhythm Abdomen: soft, non-tender; bowel sounds  normal Pelvic: Normal external female genitalia, membranes intact, no blood Extremities: Homans sign is negative, no sign of DVT DTR's normal Presentation: cephalic - confirmed with bedside U/S Fetal monitoring: Baseline: 130 bpm, Variability: Good {> 6 bpm), Accelerations: Reactive, and Decelerations: Absent Uterine activity: occasional mostly UI with cytotec Dilation: 2 Effacement (%): Thick Station: -3 Exam by:: Benjaman Lobe. CNM  Prenatal labs: ABO, Rh: --/--/O POS (09/03 0800) Antibody: NEG (09/03 0800) Rubella: 7.88 (03/28 1543) RPR: NON-REACTIVE (06/20 0901)  HBsAg: NON-REACTIVE (03/28 1543)  HIV: NON-REACTIVE (06/20 0901)  GBS:  Positive 2hr GTT: 77/110/92 (normal) Genetic screening: declined Anatomy US: normal  Prenatal Transfer Tool  Maternal Diabetes: No Genetic Screening: Declined Maternal Ultrasounds/Referrals: Normal Fetal Ultrasounds or other Referrals:  Referred to Materal Fetal Medicine  Maternal Substance Abuse:  No Significant Maternal Medications:  None Significant Maternal Lab Results:  Group B Strep positive Number of Prenatal Visits:greater than 3 verified prenatal visits Other Comments:  None  Results for orders placed or performed during the hospital encounter of 06/22/22 (from the past 24 hour(s))  CBC   Collection Time: 06/22/22  8:00 AM  Result Value Ref Range   WBC 8.4 4.0 - 10.5 K/uL   RBC 4.08 3.87 - 5.11 MIL/uL   Hemoglobin 12.0 12.0 - 15.0 g/dL   HCT 35.3 (L) 36.0 - 46.0 %   MCV 86.5 80.0 - 100.0 fL   MCH 29.4 26.0 - 34.0 pg   MCHC 34.0 30.0 - 36.0 g/dL   RDW 13.6 11.5 - 15.5 %   Platelets 197 150 - 400 K/uL   nRBC 0.0 0.0 - 0.2 %  Type and screen   Collection Time: 06/22/22  8:00 AM  Result Value Ref Range   ABO/RH(D) O POS    Antibody Screen NEG    Sample Expiration      06/25/2022,2359 Performed at [facility], [place] [address] [place], [place] [id number]    Patient Active Problem List   Diagnosis Date Noted    Gestational hypertension, third trimester 06/22/2022   Lipoma of right axilla 01/14/2022   Supervision of high risk pregnancy, antepartum 01/13/2022   Maternal age 19+, multigravida, antepartum 01/13/2022   History of severe pre-eclampsia 08/14/2015   Assessment/Plan:  Stephanie Prince is a 31 y.o. [id number] at 9w3dhere for IOL for gHTN  #Labor: IOL begun with 572m oral cytotec, did not give additional 2530mdue to history of precipitous labors and GBS status. Encouraged ambulation. #Pain: Planning epidural #FWB: Cat 1 #ID:  GBS positive, being treated with PCN #MOF: Breast #MOC: Unsure #Circ: Yes  Hilman Kissling R  Clora Ohmer, CNM  06/22/2022, 10:35 AM

## 2022-06-23 LAB — CBC
HCT: 31.5 % — ABNORMAL LOW (ref 36.0–46.0)
Hemoglobin: 10.3 g/dL — ABNORMAL LOW (ref 12.0–15.0)
MCH: 29 pg (ref 26.0–34.0)
MCHC: 32.7 g/dL (ref 30.0–36.0)
MCV: 88.7 fL (ref 80.0–100.0)
Platelets: 162 10*3/uL (ref 150–400)
RBC: 3.55 MIL/uL — ABNORMAL LOW (ref 3.87–5.11)
RDW: 13.8 % (ref 11.5–15.5)
WBC: 10 10*3/uL (ref 4.0–10.5)
nRBC: 0 % (ref 0.0–0.2)

## 2022-06-23 MED ORDER — IBUPROFEN 600 MG PO TABS
600.0000 mg | ORAL_TABLET | Freq: Four times a day (QID) | ORAL | 0 refills | Status: DC
Start: 2022-06-23 — End: 2022-07-24
  Filled 2022-06-23: qty 30, 8d supply, fill #0

## 2022-06-23 MED ORDER — FUROSEMIDE 20 MG PO TABS
20.0000 mg | ORAL_TABLET | Freq: Every day | ORAL | 0 refills | Status: DC
  Filled 2022-06-23: qty 5, 5d supply, fill #0

## 2022-06-23 NOTE — Lactation Note (Signed)
This note was copied from a baby's chart. Lactation Consultation Note  Patient Name: Stephanie Prince [id number] Date: 06/23/2022 Reason for consult: Follow-up assessment;Mother's request;Early term 37-38.6wks;Infant weight loss;Breastfeeding assistance (1.87% WL) Age:39 hours  P4, Early Term, Infant Female, 1.87% WL  LC entered the room and baby was asleep in birth parent's arms.  Birth parent states that things are going well with breastfeeding, but she has a blister on the middle of her right nipple.  LC assessed the blister.  The birth parent states that baby has a recessed chin and sometimes positioning is difficult.  LC recommended laid back position or adjusting the pillows with football position.  LC encouraged the birth parent to call when baby is ready to feed for assistance with positioning.  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   Birth parent will call RN/LC for assistance with latch.   Feeding Mother's Current Feeding Choice: Breast Milk  Interventions Interventions: Breast feeding basics reviewed;Education  Discharge Pump: Employee Pump;Hands Free  Consult Status Consult Status: Follow-up Date: 06/24/22 Follow-up type: In-patient    Stephanie Prince 06/23/2022, 12:20 PM

## 2022-06-24 ENCOUNTER — Encounter: Payer: Self-pay | Admitting: *Deleted

## 2022-06-24 ENCOUNTER — Other Ambulatory Visit (HOSPITAL_BASED_OUTPATIENT_CLINIC_OR_DEPARTMENT_OTHER): Payer: Self-pay

## 2022-06-26 ENCOUNTER — Encounter: Payer: No Typology Code available for payment source | Admitting: Obstetrics and Gynecology

## 2022-06-26 NOTE — Anesthesia Postprocedure Evaluation (Signed)
Anesthesia Post Note  Patient: Stephanie Prince  Procedure(s) Performed: AN AD HOC LABOR EPIDURAL     Patient location during evaluation: Mother Baby Anesthesia Type: Epidural Level of consciousness: awake and alert and oriented Pain management: satisfactory to patient Vital Signs Assessment: post-procedure vital signs reviewed and stable Respiratory status: respiratory function stable Cardiovascular status: stable Postop Assessment: no headache, no backache, epidural receding, patient able to bend at knees, no signs of nausea or vomiting, adequate PO intake and able to ambulate Anesthetic complications: no   No notable events documented.  Last Vitals: There were no vitals filed for this visit.  Last Pain: There were no vitals filed for this visit. Pain Goal:                   Aeryn Medici

## 2022-07-01 ENCOUNTER — Ambulatory Visit (INDEPENDENT_AMBULATORY_CARE_PROVIDER_SITE_OTHER): Payer: No Typology Code available for payment source | Admitting: *Deleted

## 2022-07-01 VITALS — BP 137/84 | HR 68 | Ht 66.0 in | Wt 152.0 lb

## 2022-07-01 DIAGNOSIS — O139 Gestational [pregnancy-induced] hypertension without significant proteinuria, unspecified trimester: Secondary | ICD-10-CM

## 2022-07-01 NOTE — Progress Notes (Cosign Needed)
Pt here for 1 week BP check post delivery.  BP is 137/84 P-68.  She will continue to monitor her BP daily at home and will call if the BP starts to creep up or she has any visual changes or headaches.

## 2022-07-02 NOTE — Progress Notes (Signed)
Chart reviewed for nurse visit. Agree with plan of care.   Noni Saupe I, NP 07/02/2022 2:25 PM

## 2022-07-03 ENCOUNTER — Encounter: Payer: No Typology Code available for payment source | Admitting: Obstetrics and Gynecology

## 2022-07-10 ENCOUNTER — Encounter: Payer: No Typology Code available for payment source | Admitting: Obstetrics and Gynecology

## 2022-07-22 ENCOUNTER — Ambulatory Visit: Payer: No Typology Code available for payment source

## 2022-07-24 ENCOUNTER — Telehealth (INDEPENDENT_AMBULATORY_CARE_PROVIDER_SITE_OTHER): Payer: No Typology Code available for payment source | Admitting: Obstetrics and Gynecology

## 2022-07-24 ENCOUNTER — Encounter: Payer: Self-pay | Admitting: Obstetrics and Gynecology

## 2022-07-24 NOTE — Progress Notes (Signed)
[place] Partum Visit Note  Stephanie Prince is a 41 y.o. [id number] female who presents for a postpartum visit. She is 4 week postpartum following a normal spontaneous vaginal delivery.  I have fully reviewed the prenatal and intrapartum course. The delivery was at 2 gestational weeks.  Anesthesia: epidural. Postpartum course has been uncomplicated. Baby is doing well. Baby is feeding by breast. Bleeding no bleeding. Bowel function is normal. Bladder function is normal. Patient is sexually active. Contraception method is  NFP . Postpartum depression screening: negative.   The pregnancy intention screening data noted above was reviewed. Potential methods of contraception were discussed. The patient elected to proceed with No data recorded.   Edinburgh Postnatal Depression Scale - 07/24/22 0849       Edinburgh Postnatal Depression Scale:  In the Past 7 Days   I have been able to laugh and see the funny side of things. 0    I have looked forward with enjoyment to things. 0    I have blamed myself unnecessarily when things went wrong. 0    I have been anxious or worried for no good reason. 0    I have felt scared or panicky for no good reason. 0    Things have been getting on top of me. 0    I have been so unhappy that I have had difficulty sleeping. 0    I have felt sad or miserable. 0    I have been so unhappy that I have been crying. 0    The thought of harming myself has occurred to me. 0    Edinburgh Postnatal Depression Scale Total 0             Health Maintenance Due  Topic Date Due   INFLUENZA VACCINE  05/20/2022    The following portions of the patient's history were reviewed and updated as appropriate: allergies, current medications, past family history, past medical history, past social history, past surgical history, and problem list.  Review of Systems Pertinent items are noted in HPI.  Objective:  There were no vitals taken for this visit.  Not examined - virtual  visit.  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       Assessment:    1. Postpartum care and examination 4 week postpartum exam.   Plan:   Essential components of care per ACOG recommendations:  1.  Mood and well being: Patient with negative depression screening today. Reviewed local resources for support.  - Patient tobacco use? No.   - hx of drug use? No.    2. Infant care and feeding:  -Patient currently breastmilk feeding? Yes. Discussed returning to work and pumping. Reviewed importance of draining breast regularly to support lactation. Will give work note if she needs it.  -Social determinants of health (SDOH) reviewed in EPIC. No concerns  3. Sexuality, contraception and birth spacing - Patient does not want a pregnancy in the next year.  Desired family size is 4 children.  - Reviewed reproductive life planning. Reviewed contraceptive methods based on pt preferences and effectiveness.  Patient desired NFP.   - Discussed birth spacing of 18 months  4. Sleep and fatigue -Encouraged family/partner/community support of 4 hrs of uninterrupted sleep to help with mood and fatigue  5. Physical Recovery  - Discussed patients delivery and complications. She describes her labor as good. - Patient had a Vaginal, no problems at  delivery. Patient had no laceration. Patient expressed understanding - Patient has urinary incontinence? No. - Patient is safe to resume physical and sexual activity  6.  Health Maintenance - HM due items addressed Yes - Last pap smear  Diagnosis  Date Value Ref Range Status  01/14/2022   Final   - Negative for intraepithelial lesion or malignancy (NILM)   Pap smear not done at today's visit.  -Breast Cancer screening indicated? No.   7. Chronic Disease/Pregnancy Condition follow up: None  - PCP follow up  Radene Gunning, [place] for [facility], [place]

## 2023-05-06 ENCOUNTER — Encounter: Payer: No Typology Code available for payment source | Admitting: Family Medicine

## 2023-05-20 ENCOUNTER — Encounter (INDEPENDENT_AMBULATORY_CARE_PROVIDER_SITE_OTHER): Payer: Self-pay

## 2023-06-02 ENCOUNTER — Encounter: Payer: 59 | Admitting: Family Medicine

## 2023-06-10 ENCOUNTER — Ambulatory Visit (INDEPENDENT_AMBULATORY_CARE_PROVIDER_SITE_OTHER): Payer: 59 | Admitting: Family Medicine

## 2023-06-10 ENCOUNTER — Encounter: Payer: Self-pay | Admitting: Family Medicine

## 2023-06-10 VITALS — BP 118/64 | HR 69 | Temp 98.7°F | Ht 64.0 in | Wt 149.0 lb

## 2023-06-10 DIAGNOSIS — Z0000 Encounter for general adult medical examination without abnormal findings: Secondary | ICD-10-CM

## 2023-06-10 NOTE — Patient Instructions (Addendum)
Give [place] 2-3 business days to get the results of your labs back.   Keep the diet clean and stay active.  Please consider adding some weight resistance exercise to your routine. Consider yoga as well.   Please get me a copy of your advanced directive form at your convenience.   I recommend getting the flu shot in mid October. This suggestion would change if the CDC comes out with a different recommendation.   Consider getting a forearm strap to help your elbow heal. Any brand would work, but [employer] is an example.   Elbow and Forearm Exercises It is normal to feel mild stretching, pulling, tightness, or discomfort as you do these exercises, but you should stop right away if you feel sudden pain or your pain gets worse.  RANGE OF MOTION EXERCISES These exercises warm up your muscles and joints and improve the movement and flexibility of your injured elbow and forearm. These exercises also help to relieve pain, numbness, and tingling. These exercises are done using the muscles in your injured elbow and forearm. Exercise A: Elbow Flexion, Active Hold your left / right arm at your side, and bend your elbow as far as you can using your left / right arm muscles. Hold this position for 30 seconds. Slowly return to the starting position. Repeat 2 times. Complete this exercise 3 times per week. Exercise B: Elbow Extension, Active Hold your left / right arm at your side, and straighten your elbow as much as you can using your left / right arm muscles. Hold this position for 30 seconds. Slowly return to the starting position. Repeat 2 times. Complete this exercise 3 times per week. Exercise C: Forearm Rotation, Supination, Active Stand or sit with your elbows at your sides. Bend your left / right elbow to an "L" shape (90 degrees). Turn your palm upward until you feel a gentle stretch on the inside of your forearm. Hold this position for 30 seconds. Slowly release and return to the starting  position. Repeat 2 times. Complete this exercise 3 times per week. Exercise D: Forearm Rotation, Pronation, Active Stand or sit with your elbows at your side. Bend your left / right elbow to an "L" shape (90 degrees). Turn your left / right palm downward until you feel a gentle stretch on the top of your forearm. Hold this position for 30 seconds. Slowly release and return to the starting position. Repeat 2 times. Complete this exercise 3 times per week. STRETCHING EXERCISES These exercises warm up your muscles and joints and improve the movement and flexibility of your injured elbow and forearm. These exercises also help to relieve pain, numbness, and tingling. These exercises are done using your healthy elbow and forearm to help stretch the muscles in your injured elbow and forearm. Exercise E: Elbow Flexion, Active-Assisted  Hold your left / right arm at your side, and bend your elbow as much as you can using your left / right arm muscles. Use your other hand to bend your left / right elbow farther. To do this, gently push up on your forearm until you feel a gentle stretch on the back of your elbow. Hold this position for 30 seconds. Slowly return to the starting position. Repeat 2 times. Complete this exercise 3 times per week. Exercise F: Elbow Extension, Active-Assisted  Hold your left / right arm at your side, and straighten your elbow as much as you can using your left / right arm muscles. Use your other hand to  straighten the left / right elbow farther. To do this, gently push down on your forearm until you feel a gentle stretch on the inside of your elbow. Hold this position for 30 seconds. Slowly return to the starting position. Repeat 2 times. Complete this exercise 3 times per weeky. Exercise G: Forearm Rotation, Supination, Active-Assisted  Sit with your left / right elbow bent in an "L" shape (90 degrees) with your forearm resting on a table. Keeping your upper body and  shoulder still, rotate your forearm so your left / right palm faces upward. Use your other hand to help rotate your forearm further until you feel a gentle to moderate stretch. Hold this position for 30 seconds. Slowly release the stretch and return to the starting position. Repeat 2 times. Complete this exercise 3 times per week. Exercise H: Forearm Rotation, Pronation, Active-Assisted  Sit with your left / right elbow bent in an "L" shape (90 degrees) with your forearm resting on a table. Keeping your upper body and shoulder still, rotate your forearm so your palm faces the tabletop. Use your other hand to help rotate your forearm further until you feel a gentle to moderate stretch. Hold this position for 30 seconds. Slowly release the stretch and return to the starting position. Repeat 2 times. Complete this exercise 3 times per week. Exercise I: Elbow Flexion, Supine, Passive Lie on your back. Extend your left / right arm up in the air, bracing it with your other hand. Let your left / right your hand slowly lower toward your shoulder, while your elbow stays pointed toward the ceiling. You should feel a gentle stretch along the back of your upper arm and elbow. If instructed by your health care provider, you may increase the intensity of your stretch by adding a small wrist weight or hand weight. Hold this position for 3 seconds. Slowly return to the starting position. Repeat 2 times. Complete this exercise 3 times per week. Exercise J: Elbow Extension, Supine, Passive  Lie on your back. Make sure that you are in a comfortable position that lets you relax your arm muscles. Place a folded towel under your left / right upper arm so your elbow and shoulder are at the same height. Straighten your left / right arm so your elbow does not rest on the bed or towel. Let the weight of your hand stretch your elbow. Keep your arm and chest muscles relaxed. You should feel a stretch on the inside of  your elbow. If told by your health care provider, you may increase the intensity of your stretch by adding a small wrist weight or hand weight. Hold this position for 30 seconds. Slowly release the stretch. Repeat 2 times. Complete this exercise 3 times per week. STRENGTHENING EXERCISES These exercises build strength and endurance in your elbow and forearm. Endurance is the ability to use your muscles for a long time, even after they get tired. Exercise K: Elbow Flexion, Isometric  Stand or sit up straight. Bend your left / right elbow in an "L" shape (90 degrees) and turn your palm up so your forearm is at the height of your waist. Place your other hand on top of your forearm. Gently push down as your left / right arm resists. Push as hard as you can with both arms without causing any pain or movement at your left / right elbow. Hold this position for 3 seconds. Slowly release the tension in both arms. Let your muscles relax completely before  repeating. Repeat 2 times. Complete this exercise 3 times per week. Exercise L: Elbow Extensors, Isometric  Stand or sit up straight. Place your left / right arm so your palm faces your abdomen and it is at the height of your waist. Place your other hand on the underside of your forearm. Gently push up as your left / right arm resists. Push as hard as you can with both arms, without causing any pain or movement at your left / right elbow. Hold this position for 3 seconds. Slowly release the tension in both arms. Let your muscles relax completely before repeating. Repeat _______2___ times. Complete this exercise 3 times per week. Exercise M: Elbow Flexion With Forearm Palm Up  Sit upright on a firm chair without armrests, or stand. Place your left / right arm at your side with your palm facing forward. Holding a 5 lbweight or gripping a rubber exercise band or tubing, bend your elbow to bring your hand toward your shoulder. Hold this position for 3  seconds. Slowly return to the starting position. Repeat 2 times. Complete this exercise 3 times per week. Exercise N: Elbow Extension  Sit on a firm chair without armrests, or stand. Keeping your upper arms at your sides, bring both hands up toward your left / right shoulder while you grip a rubber exercise band or tubing. Your left / right hand should be just below the other hand. Straighten your left / right elbow. Hold this position for 3 seconds. Control the resistance of the band or tubing as your hand returns to your side. Repeat 2 times. Complete this exercise 3 times per week. Exercise O: Forearm Rotation, Supination  Sit with your left / right forearm supported on a table. Keep your elbow at waist height. Rest your hand over the edge of the table with your palm facing down. Gently hold a lightweight hammer. Without moving your elbow, slowly rotate your forearm to turn your palm and hand upward to a "thumbs-up" position. Hold this position for 3 seconds. Slowly return to the starting position. Repeat 2 times. Complete this exercise 3 times per week. Exercise P: Forearm Rotation, Pronation  Sit with your left / right forearm supported on a table. Keep your elbow below shoulder height. Rest your hand over the edge of the table with your palm facing up. Gently hold a lightweight hammer. Without moving your elbow, slowly rotate your forearm to turn your palm and hand upward to a "thumbs-up" position. Hold this position for 3 seconds. Slowly return to the starting position. Repeat 2 times. Complete this exercise 3 times per week.  Make sure you discuss any questions you have with your health care provider. Document Released: 08/20/2005 Document Revised: 02/14/2016 Document Reviewed: 07/01/2015 Elsevier Interactive Patient Education  [employer].

## 2023-06-10 NOTE — Progress Notes (Signed)
Chief Complaint  Patient presents with   Annual Exam     Well Woman Stephanie Prince is here for a complete physical.   Her last physical was >1 year ago.  Current diet: in general, diet is fair.  Current exercise: active w landscaping. Contraception? Yes Fatigue out of ordinary? No Seatbelt? Yes Advanced directive? No  Health Maintenance Pap/HPV- Yes Tetanus- Yes HIV screening- Yes Hep C screening- Yes  Past Medical History:  Diagnosis Date   Anemia    History of gestational hypertension    History of pre-eclampsia    Hx of varicella      Past Surgical History:  Procedure Laterality Date   APPENDECTOMY     WISDOM TOOTH EXTRACTION      Medications  No current outpatient medications on file prior to visit.   No current facility-administered medications on file prior to visit.     Allergies No Known Allergies  Review of Systems: Constitutional:  no unexpected weight changes Eye:  no recent significant change in vision Ear/Nose/Mouth/Throat:  Ears:  no tinnitus or vertigo and no recent change in hearing Nose/Mouth/Throat:  no complaints of nasal congestion, no sore throat Cardiovascular: no chest pain Respiratory:  no cough and no shortness of breath Gastrointestinal:  no abdominal pain, no change in bowel habits GU:  Female: negative for dysuria or pelvic pain Musculoskeletal/Extremities:  no pain of the joints Integumentary (Skin/Breast):  no abnormal skin lesions reported Neurologic:  no headaches Endocrine:  denies fatigue Hematologic/Lymphatic:  No areas of easy bleeding  Exam BP 118/64 (BP Location: Left Arm, Patient Position: Sitting, Cuff Size: Normal)   Pulse 69   Temp 98.7 F (37.1 C) (Oral)   Ht 5\' 4"  (1.626 m)   Wt 149 lb (67.6 kg)   SpO2 99%   BMI 25.58 kg/m  General:  well developed, well nourished, in no apparent distress Skin:  no significant moles, warts, or growths Head:  no masses, lesions, or tenderness Eyes:  pupils equal and  round, sclera anicteric without injection Ears:  canals without lesions, TMs shiny without retraction, no obvious effusion, no erythema Nose:  nares patent, mucosa normal, and no drainage  Throat/Pharynx:  lips and gingiva without lesion; tongue and uvula midline; non-inflamed pharynx; no exudates or postnasal drainage Neck: neck supple without adenopathy, thyromegaly, or masses Lungs:  clear to auscultation, breath sounds equal bilaterally, no respiratory distress Cardio:  regular rate and rhythm, no bruits, no LE edema Abdomen:  abdomen soft, nontender; bowel sounds normal; no masses or organomegaly Genital: Defer to GYN Musculoskeletal:  symmetrical muscle groups noted without atrophy or deformity Extremities:  no clubbing, cyanosis, or edema, no deformities, no skin discoloration Neuro:  gait normal; deep tendon reflexes normal and symmetric Psych: well oriented with normal range of affect and appropriate judgment/insight  Assessment and Plan  Well adult exam - Plan: CBC, Comprehensive metabolic panel, Lipid panel, TSH, T4, free, VITAMIN D 25 Hydroxy (Vit-D Deficiency, Fractures), Magnesium, Hemoglobin A1c   Well 42 y.o. female. Counseled on diet and exercise. Advanced directive form provided today.  Other orders as above. Follow up in 1 y. The patient voiced understanding and agreement to the plan.  Stephanie Roche [place], DO 06/10/23 3:01 PM

## 2023-06-11 LAB — COMPREHENSIVE METABOLIC PANEL
ALT: 10 U/L (ref 0–35)
AST: 14 U/L (ref 0–37)
Albumin: 4.4 g/dL (ref 3.5–5.2)
Alkaline Phosphatase: 57 U/L (ref 39–117)
BUN: 12 mg/dL (ref 6–23)
CO2: 31 mEq/L (ref 19–32)
Calcium: 9.7 mg/dL (ref 8.4–10.5)
Chloride: 100 mEq/L (ref 96–112)
Creatinine, Ser: 0.84 mg/dL (ref 0.40–1.20)
GFR: 87.4 mL/min (ref >60.00)
Glucose, Bld: 95 mg/dL (ref 70–99)
Potassium: 5 meq/L (ref 3.5–5.1)
Sodium: 138 meq/L (ref 135–145)
Total Bilirubin: 0.3 mg/dL (ref 0.2–1.2)
Total Protein: 7.6 g/dL (ref 6.0–8.3)

## 2023-06-11 LAB — CBC
HCT: 37.9 % (ref 36.0–46.0)
Hemoglobin: 12.1 g/dL (ref 12.0–15.0)
MCHC: 32 g/dL (ref 30.0–36.0)
MCV: 86.8 fl (ref 78.0–100.0)
Platelets: 281 10*3/uL (ref 150.0–400.0)
RBC: 4.37 Mil/uL (ref 3.87–5.11)
RDW: 14.2 % (ref 11.5–15.5)
WBC: 7.3 10*3/uL (ref 4.0–10.5)

## 2023-06-11 LAB — LIPID PANEL
Cholesterol: 202 mg/dL — ABNORMAL HIGH (ref 0–200)
HDL: 46.9 mg/dL (ref >39.00)
LDL Cholesterol: 136 mg/dL — ABNORMAL HIGH (ref 0–99)
NonHDL: 155.28
Total CHOL/HDL Ratio: 4
Triglycerides: 94 mg/dL (ref 0.0–149.0)
VLDL: 18.8 mg/dL (ref 0.0–40.0)

## 2023-06-11 LAB — MAGNESIUM: Magnesium: 1.9 mg/dL (ref 1.5–2.5)

## 2023-06-11 LAB — HEMOGLOBIN A1C: Hgb A1c MFr Bld: 5.5 % (ref 4.6–6.5)

## 2023-06-12 ENCOUNTER — Other Ambulatory Visit: Payer: Self-pay | Admitting: Family Medicine

## 2023-06-12 DIAGNOSIS — E785 Hyperlipidemia, unspecified: Secondary | ICD-10-CM

## 2023-06-12 LAB — VITAMIN D 25 HYDROXY (VIT D DEFICIENCY, FRACTURES): VITD: 30.53 ng/mL (ref 30.00–100.00)

## 2023-06-12 LAB — TSH: TSH: 1.4 u[IU]/mL (ref 0.35–5.50)

## 2023-06-12 LAB — T4, FREE: Free T4: 0.82 ng/dL (ref 0.60–1.60)

## 2023-06-15 ENCOUNTER — Encounter: Payer: Self-pay | Admitting: Family Medicine

## 2024-01-27 ENCOUNTER — Encounter: Payer: Self-pay | Admitting: Family Medicine

## 2024-01-27 ENCOUNTER — Ambulatory Visit (INDEPENDENT_AMBULATORY_CARE_PROVIDER_SITE_OTHER): Admitting: Family Medicine

## 2024-01-27 VITALS — BP 118/80 | HR 67 | Temp 97.6°F | Ht 64.0 in | Wt 152.2 lb

## 2024-01-27 DIAGNOSIS — G8929 Other chronic pain: Secondary | ICD-10-CM

## 2024-01-27 DIAGNOSIS — E785 Hyperlipidemia, unspecified: Secondary | ICD-10-CM | POA: Diagnosis not present

## 2024-01-27 DIAGNOSIS — R109 Unspecified abdominal pain: Secondary | ICD-10-CM | POA: Diagnosis not present

## 2024-01-27 LAB — LIPID PANEL
Cholesterol: 167 mg/dL (ref 0–200)
HDL: 40.6 mg/dL (ref >39.00)
LDL Cholesterol: 115 mg/dL — ABNORMAL HIGH (ref 0–99)
NonHDL: 126.73
Total CHOL/HDL Ratio: 4
Triglycerides: 58 mg/dL (ref 0.0–149.0)
VLDL: 11.6 mg/dL (ref 0.0–40.0)

## 2024-01-27 LAB — COMPREHENSIVE METABOLIC PANEL WITH GFR
ALT: 10 U/L (ref 0–35)
AST: 13 U/L (ref 0–37)
Albumin: 4.5 g/dL (ref 3.5–5.2)
Alkaline Phosphatase: 47 U/L (ref 39–117)
BUN: 15 mg/dL (ref 6–23)
CO2: 28 meq/L (ref 19–32)
Calcium: 9 mg/dL (ref 8.4–10.5)
Chloride: 104 meq/L (ref 96–112)
Creatinine, Ser: 0.73 mg/dL (ref 0.40–1.20)
GFR: 102.98 mL/min (ref >60.00)
Glucose, Bld: 94 mg/dL (ref 70–99)
Potassium: 4.7 meq/L (ref 3.5–5.1)
Sodium: 138 meq/L (ref 135–145)
Total Bilirubin: 0.4 mg/dL (ref 0.2–1.2)
Total Protein: 7.4 g/dL (ref 6.0–8.3)

## 2024-01-27 NOTE — Progress Notes (Signed)
 Chief Complaint  Patient presents with   Abdominal Pain    Patient presents today for abdominal pain. She would like to discuss probably a MRI or CT scan due to previously ultrasound scan results.    Stephanie Prince is here for abdominal pain.  Duration: several days Nighttime awakenings? No Bleeding? No Weight loss? No Palliation: Nothing Provocation: Nothing Associated symptoms: nausea and vomiting Denies: fever  Treatment to date: Protonix, various over-the-counter options without relief Ultrasound showed hepatic cysts.  Follow-up showed stability suggesting hemangiomas. She does have a family history of gallbladder carcinoma.  Past Medical History:  Diagnosis Date   Anemia    History of gestational hypertension    History of pre-eclampsia    Hx of varicella     BP 118/80   Pulse 67   Temp 97.6 F (36.4 C)   Ht 5\' 4"  (1.626 m)   Wt 152 lb 3.2 oz (69 kg)   LMP 01/04/2024 (Exact Date)   SpO2 96%   Breastfeeding No   BMI 26.13 kg/m  Gen.: Awake, alert, appears stated age HEENT: Mucous membranes moist without mucosal lesions Heart: Regular rate and rhythm without murmurs Lungs: Clear auscultation bilaterally, no rales or wheezing, normal effort without accessory muscle use. Abdomen: Bowel sounds are present. Abdomen is soft, nontender, nondistended, no masses or organomegaly. Negative Murphy's, Rovsing's, McBurney's, and Carnett's sign. Psych: Age appropriate judgment and insight. Normal mood and affect.  Chronic abdominal pain - Plan: CT ABDOMEN W CONTRAST, Ambulatory referral to Gastroenterology  Hyperlipidemia, unspecified hyperlipidemia type - Plan: Comprehensive metabolic panel with GFR, Lipid panel  Check CT abdomen with contrast to rule out any sinister gallbladder pathosis.  Refer to GI for their opinion.  Check labs.  Could consider reflux medication like Pepcid as needed.  Offered antispasmodic which she politely declined at this time. F/u as originally  scheduled. Pt voiced understanding and agreement to the plan.  Jilda Roche [place], DO 01/27/24 10:50 AM

## 2024-01-27 NOTE — Patient Instructions (Signed)
 Give [place] 2-3 business days to get the results of your labs back.   If you do not hear anything about your referral in the next 1-2 weeks, call our office and ask for an update.  Someone will reach out in a few days with scheduling your scan.   Let us know if you need anything.

## 2024-02-03 ENCOUNTER — Ambulatory Visit: Payer: Self-pay | Admitting: Family Medicine

## 2024-02-10 ENCOUNTER — Ambulatory Visit

## 2024-02-10 DIAGNOSIS — R1084 Generalized abdominal pain: Secondary | ICD-10-CM | POA: Diagnosis not present

## 2024-02-10 DIAGNOSIS — R1013 Epigastric pain: Secondary | ICD-10-CM | POA: Diagnosis not present

## 2024-02-10 DIAGNOSIS — G8929 Other chronic pain: Secondary | ICD-10-CM

## 2024-02-10 MED ORDER — IOHEXOL 300 MG/ML  SOLN: 100.0000 mL | Freq: Once | INTRAMUSCULAR | Status: DC | PRN

## 2024-02-10 MED ORDER — IOHEXOL 300 MG/ML  SOLN
100.0000 mL | Freq: Once | INTRAMUSCULAR | Status: AC | PRN
  Administered 2024-02-10: 100 mL via INTRAVENOUS

## 2024-02-16 ENCOUNTER — Encounter: Payer: Self-pay | Admitting: Family Medicine

## 2024-03-31 ENCOUNTER — Encounter: Payer: Self-pay | Admitting: Family Medicine

## 2024-06-15 ENCOUNTER — Encounter: Payer: 59 | Admitting: Family Medicine
